# Patient Record
Sex: Female | Born: 1990 | Race: Black or African American | Hispanic: No | Marital: Single | Smoking: Current every day smoker
Health system: Southern US, Community
[De-identification: ages and names within clinical notes are randomized; demographics above are authoritative.]

## PROBLEM LIST (undated history)

## (undated) DIAGNOSIS — A539 Syphilis, unspecified: Secondary | ICD-10-CM

## (undated) DIAGNOSIS — A599 Trichomoniasis, unspecified: Secondary | ICD-10-CM

## (undated) HISTORY — PX: NO PAST SURGERIES: SHX2092

---
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
## 2012-01-05 ENCOUNTER — Emergency Department: Payer: Self-pay | Admitting: Emergency Medicine

## 2013-05-13 ENCOUNTER — Emergency Department: Payer: Self-pay | Admitting: Internal Medicine

## 2013-08-14 ENCOUNTER — Emergency Department: Payer: Self-pay | Admitting: Emergency Medicine

## 2013-08-14 LAB — BASIC METABOLIC PANEL
ANION GAP: 8 (ref 7–16)
BUN: 12 mg/dL (ref 7–18)
CALCIUM: 10.1 mg/dL (ref 8.5–10.1)
CHLORIDE: 108 mmol/L — AB (ref 98–107)
CO2: 23 mmol/L (ref 21–32)
CREATININE: 1.07 mg/dL (ref 0.60–1.30)
EGFR (African American): >60
Glucose: 87 mg/dL (ref 65–99)
Osmolality: 277 (ref 275–301)
POTASSIUM: 3.6 mmol/L (ref 3.5–5.1)
Sodium: 139 mmol/L (ref 136–145)

## 2013-08-14 LAB — CBC
HCT: 42 % (ref 35.0–47.0)
HGB: 13.8 g/dL (ref 12.0–16.0)
MCH: 27.8 pg (ref 26.0–34.0)
MCHC: 32.8 g/dL (ref 32.0–36.0)
MCV: 85 fL (ref 80–100)
PLATELETS: 254 10*3/uL (ref 150–440)
RBC: 4.95 10*6/uL (ref 3.80–5.20)
RDW: 13.2 % (ref 11.5–14.5)
WBC: 6.8 10*3/uL (ref 3.6–11.0)

## 2013-08-14 LAB — TROPONIN I: Troponin-I: <0.02 ng/mL

## 2014-05-06 ENCOUNTER — Emergency Department: Payer: Self-pay | Admitting: Emergency Medicine

## 2015-02-11 ENCOUNTER — Encounter (HOSPITAL_COMMUNITY): Payer: Self-pay | Admitting: Emergency Medicine

## 2015-02-11 ENCOUNTER — Emergency Department (HOSPITAL_COMMUNITY)
Admission: EM | Admit: 2015-02-11 | Discharge: 2015-02-11 | Disposition: A | Discharge status: Home / Self Care | Payer: Self-pay | Attending: Emergency Medicine | Admitting: Emergency Medicine

## 2015-02-11 ENCOUNTER — Emergency Department (HOSPITAL_COMMUNITY): Payer: Self-pay

## 2015-02-11 DIAGNOSIS — Z3202 Encounter for pregnancy test, result negative: Secondary | ICD-10-CM | POA: Insufficient documentation

## 2015-02-11 DIAGNOSIS — N12 Tubulo-interstitial nephritis, not specified as acute or chronic: Secondary | ICD-10-CM | POA: Insufficient documentation

## 2015-02-11 DIAGNOSIS — N3001 Acute cystitis with hematuria: Secondary | ICD-10-CM | POA: Insufficient documentation

## 2015-02-11 DIAGNOSIS — A5901 Trichomonal vulvovaginitis: Secondary | ICD-10-CM | POA: Insufficient documentation

## 2015-02-11 LAB — URINALYSIS, ROUTINE W REFLEX MICROSCOPIC
BILIRUBIN URINE: NEGATIVE
Glucose, UA: NEGATIVE mg/dL
KETONES UR: NEGATIVE mg/dL
NITRITE: POSITIVE — AB
PH: 8 (ref 5.0–8.0)
Protein, ur: NEGATIVE mg/dL
SPECIFIC GRAVITY, URINE: 1.015 (ref 1.005–1.030)
UROBILINOGEN UA: 1 mg/dL (ref 0.0–1.0)

## 2015-02-11 LAB — CBC
HEMATOCRIT: 43.6 % (ref 36.0–46.0)
Hemoglobin: 15 g/dL (ref 12.0–15.0)
MCH: 28.6 pg (ref 26.0–34.0)
MCHC: 34.4 g/dL (ref 30.0–36.0)
MCV: 83 fL (ref 78.0–100.0)
PLATELETS: 236 10*3/uL (ref 150–400)
RBC: 5.25 MIL/uL — ABNORMAL HIGH (ref 3.87–5.11)
RDW: 13 % (ref 11.5–15.5)
WBC: 8.7 10*3/uL (ref 4.0–10.5)

## 2015-02-11 LAB — COMPREHENSIVE METABOLIC PANEL
ALBUMIN: 4.7 g/dL (ref 3.5–5.0)
ALT: 13 U/L — AB (ref 14–54)
AST: 21 U/L (ref 15–41)
Alkaline Phosphatase: 80 U/L (ref 38–126)
Anion gap: 12 (ref 5–15)
BILIRUBIN TOTAL: 0.7 mg/dL (ref 0.3–1.2)
BUN: 9 mg/dL (ref 6–20)
CO2: 21 mmol/L — ABNORMAL LOW (ref 22–32)
CREATININE: 0.95 mg/dL (ref 0.44–1.00)
Calcium: 10.6 mg/dL — ABNORMAL HIGH (ref 8.9–10.3)
Chloride: 105 mmol/L (ref 101–111)
GFR calc Af Amer: >60 mL/min (ref >60)
GLUCOSE: 91 mg/dL (ref 65–99)
POTASSIUM: 4 mmol/L (ref 3.5–5.1)
Sodium: 138 mmol/L (ref 135–145)
TOTAL PROTEIN: 8.2 g/dL — AB (ref 6.5–8.1)

## 2015-02-11 LAB — WET PREP, GENITAL: CLUE CELLS WET PREP: NONE SEEN

## 2015-02-11 LAB — URINE MICROSCOPIC-ADD ON

## 2015-02-11 LAB — I-STAT CG4 LACTIC ACID, ED: Lactic Acid, Venous: 0.99 mmol/L (ref 0.5–2.0)

## 2015-02-11 LAB — LIPASE, BLOOD: Lipase: 31 U/L (ref 22–51)

## 2015-02-11 LAB — I-STAT BETA HCG BLOOD, ED (MC, WL, AP ONLY): I-stat hCG, quantitative: <5 m[IU]/mL (ref <5)

## 2015-02-11 MED ORDER — METRONIDAZOLE 500 MG PO TABS
2000.0000 mg | ORAL_TABLET | Freq: Once | ORAL | Status: AC
  Administered 2015-02-11: 2000 mg via ORAL
  Filled 2015-02-11: qty 4

## 2015-02-11 MED ORDER — SODIUM CHLORIDE 0.9 % IV BOLUS (SEPSIS)
1000.0000 mL | Freq: Once | INTRAVENOUS | Status: AC
  Administered 2015-02-11: 1000 mL via INTRAVENOUS

## 2015-02-11 MED ORDER — ACETAMINOPHEN 325 MG PO TABS
650.0000 mg | ORAL_TABLET | Freq: Once | ORAL | Status: AC
  Administered 2015-02-11: 650 mg via ORAL
  Filled 2015-02-11: qty 2

## 2015-02-11 MED ORDER — ONDANSETRON 4 MG PO TBDP
4.0000 mg | ORAL_TABLET | Freq: Once | ORAL | Status: AC
  Administered 2015-02-11: 4 mg via ORAL

## 2015-02-11 MED ORDER — ONDANSETRON 4 MG PO TBDP
ORAL_TABLET | ORAL | Status: AC
  Filled 2015-02-11: qty 1

## 2015-02-11 MED ORDER — LEVOFLOXACIN 750 MG PO TABS: 750.0000 mg | ORAL_TABLET | Freq: Every day | ORAL | Status: AC

## 2015-02-11 NOTE — Discharge Instructions (Signed)
Pyelonephritis, Adult Pyelonephritis is a kidney infection. A kidney infection can happen quickly, or it can last for a long time. HOME CARE   Take your medicine (antibiotics) as told. Finish it even if you start to feel better.  Keep all doctor visits as told.  Drink enough fluids to keep your pee (urine) clear or pale yellow.  Only take medicine as told by your doctor. GET HELP RIGHT AWAY IF:   You have a fever or lasting symptoms for more than 2-3 days.  You have a fever and your symptoms suddenly get worse.  You cannot take your medicine or drink fluids as told.  You have chills and shaking.  You feel very weak or pass out (faint).  You do not feel better after 2 days. MAKE SURE YOU:  Understand these instructions.  Will watch your condition.  Will get help right away if you are not doing well or get worse. Document Released: 06/08/2004 Document Revised: 10/31/2011 Document Reviewed: 10/19/2010 [facility] Patient Information 2015 [place], [place]. This information is not intended to replace advice given to you by your health care provider. Make sure you discuss any questions you have with your health care provider.  Sexually Transmitted Disease A sexually transmitted disease (STD) is a disease or infection that may be passed (transmitted) from person to person, usually during sexual activity. This may happen by way of saliva, semen, blood, vaginal mucus, or urine. Common STDs include:   Gonorrhea.   Chlamydia.   Syphilis.   HIV and AIDS.   Genital herpes.   Hepatitis B and C.   Trichomonas.   Human papillomavirus (HPV).   Pubic lice.   Scabies.  Mites.  Bacterial vaginosis. WHAT ARE CAUSES OF STDs? An STD may be caused by bacteria, a virus, or parasites. STDs are often transmitted during sexual activity if one person is infected. However, they may also be transmitted through nonsexual means. STDs may be transmitted after:   Sexual intercourse  with an infected person.   Sharing sex toys with an infected person.   Sharing needles with an infected person or using unclean piercing or tattoo needles.  Having intimate contact with the genitals, mouth, or rectal areas of an infected person.   Exposure to infected fluids during birth. WHAT ARE THE SIGNS AND SYMPTOMS OF STDs? Different STDs have different symptoms. Some people may not have any symptoms. If symptoms are present, they may include:   Painful or bloody urination.   Pain in the pelvis, abdomen, vagina, anus, throat, or eyes.   A skin rash, itching, or irritation.  Growths, ulcerations, blisters, or sores in the genital and anal areas.  Abnormal vaginal discharge with or without bad odor.   Penile discharge in men.   Fever.   Pain or bleeding during sexual intercourse.   Swollen glands in the groin area.   Yellow skin and eyes (jaundice). This is seen with hepatitis.   Swollen testicles.  Infertility.  Sores and blisters in the mouth. HOW ARE STDs DIAGNOSED? To make a diagnosis, your health care provider may:   Take a medical history.   Perform a physical exam.   Take a sample of any discharge to examine.  Swab the throat, cervix, opening to the penis, rectum, or vagina for testing.  Test a sample of your first morning urine.   Perform blood tests.   Perform a Pap test, if this applies.   Perform a colposcopy.   Perform a laparoscopy.  HOW ARE STDs  TREATED? Treatment depends on the STD. Some STDs may be treated but not cured.   Chlamydia, gonorrhea, trichomonas, and syphilis can be cured with antibiotic medicine.   Genital herpes, hepatitis, and HIV can be treated, but not cured, with prescribed medicines. The medicines lessen symptoms.   Genital warts from HPV can be treated with medicine or by freezing, burning (electrocautery), or surgery. Warts may come back.   HPV cannot be cured with medicine or surgery.  However, abnormal areas may be removed from the cervix, vagina, or vulva.   If your diagnosis is confirmed, your recent sexual partners need treatment. This is true even if they are symptom-free or have a negative culture or evaluation. They should not have sex until their health care providers say it is okay. HOW CAN I REDUCE MY RISK OF GETTING AN STD? Take these steps to reduce your risk of getting an STD:  Use latex condoms, dental dams, and water-soluble lubricants during sexual activity. Do not use petroleum jelly or oils.  Avoid having multiple sex partners.  Do not have sex with someone who has other sex partners.  Do not have sex with anyone you do not know or who is at high risk for an STD.  Avoid risky sex practices that can break your skin.  Do not have sex if you have open sores on your mouth or skin.  Avoid drinking too much alcohol or taking illegal drugs. Alcohol and drugs can affect your judgment and put you in a vulnerable position.  Avoid engaging in oral and anal sex acts.  Get vaccinated for HPV and hepatitis. If you have not received these vaccines in the past, talk to your health care provider about whether one or both might be right for you.   If you are at risk of being infected with HIV, it is recommended that you take a prescription medicine daily to prevent HIV infection. This is called pre-exposure prophylaxis (PrEP). You are considered at risk if:  You are a man who has sex with other men (MSM).  You are a heterosexual man or woman and are sexually active with more than one partner.  You take drugs by injection.  You are sexually active with a partner who has HIV.  Talk with your health care provider about whether you are at high risk of being infected with HIV. If you choose to begin PrEP, you should first be tested for HIV. You should then be tested every 3 months for as long as you are taking PrEP.  WHAT SHOULD I DO IF I THINK I HAVE AN  STD?  See your health care provider.   Tell your sexual partner(s). They should be tested and treated for any STDs.  Do not have sex until your health care provider says it is okay. WHEN SHOULD I GET IMMEDIATE MEDICAL CARE? Contact your health care provider right away if:   You have severe abdominal pain.  You are a man and notice swelling or pain in your testicles.  You are a woman and notice swelling or pain in your vagina. Document Released: 07/22/2002 Document Revised: 05/06/2013 Document Reviewed: 11/19/2012 [facility] Patient Information 2015 [place], [place]. This information is not intended to replace advice given to you by your health care provider. Make sure you discuss any questions you have with your health care provider.  Trichomoniasis Trichomoniasis is an infection caused by an organism called Trichomonas. The infection can affect both women and men. In women, the outer female  genitalia and the vagina are affected. In men, the penis is mainly affected, but the prostate and other reproductive organs can also be involved. Trichomoniasis is a sexually transmitted infection (STI) and is most often passed to another person through sexual contact.  RISK FACTORS  Having unprotected sexual intercourse.  Having sexual intercourse with an infected partner. SIGNS AND SYMPTOMS  Symptoms of trichomoniasis in women include:  Abnormal gray-green frothy vaginal discharge.  Itching and irritation of the vagina.  Itching and irritation of the area outside the vagina. Symptoms of trichomoniasis in men include:   Penile discharge with or without pain.  Pain during urination. This results from inflammation of the urethra. DIAGNOSIS  Trichomoniasis may be found during a Pap test or physical exam. Your health care provider may use one of the following methods to help diagnose this infection:  Examining vaginal discharge under a microscope. For men, urethral discharge would be  examined.  Testing the pH of the vagina with a test tape.  Using a vaginal swab test that checks for the Trichomonas organism. A test is available that provides results within a few minutes.  Doing a culture test for the organism. This is not usually needed. TREATMENT   You may be given medicine to fight the infection. Women should inform their health care provider if they could be or are pregnant. Some medicines used to treat the infection should not be taken during pregnancy.  Your health care provider may recommend over-the-counter medicines or creams to decrease itching or irritation.  Your sexual partner will need to be treated if infected. HOME CARE INSTRUCTIONS   Take medicines only as directed by your health care provider.  Take over-the-counter medicine for itching or irritation as directed by your health care provider.  Do not have sexual intercourse while you have the infection.  Women should not douche or wear tampons while they have the infection.  Discuss your infection with your partner. Your partner may have gotten the infection from you, or you may have gotten it from your partner.  Have your sex partner get examined and treated if necessary.  Practice safe, informed, and protected sex.  See your health care provider for other STI testing. SEEK MEDICAL CARE IF:   You still have symptoms after you finish your medicine.  You develop abdominal pain.  You have pain when you urinate.  You have bleeding after sexual intercourse.  You develop a rash.  Your medicine makes you sick or makes you throw up (vomit). MAKE SURE YOU:  Understand these instructions.  Will watch your condition.  Will get help right away if you are not doing well or get worse. Document Released: 10/25/2000 Document Revised: 09/15/2013 Document Reviewed: 02/10/2013 [facility] Patient Information 2015 [place], [place]. This information is not intended to replace advice given to you by  your health care provider. Make sure you discuss any questions you have with your health care provider.

## 2015-02-11 NOTE — ED Notes (Signed)
Pt sts abd pain and N/V with some blood in vomit today; pt sts started on Monday; pt hyperventilating at present; pt sts LMP was Feb 2016

## 2015-02-11 NOTE — ED Provider Notes (Signed)
CSN: [id number]     Arrival date & time 02/11/15  1128 History   First MD Initiated Contact with Patient 02/11/15 1456     Chief Complaint  Patient presents with  . Emesis  . Abdominal Pain     (Consider location/radiation/quality/duration/timing/severity/associated sxs/prior Treatment) Patient is a 24 y.o. female presenting with abdominal pain and cough.  Abdominal Pain Associated symptoms: chills, cough, dysuria, fever and shortness of breath   Associated symptoms: no chest pain, no hematuria and no vaginal bleeding   Cough Cough characteristics:  Productive Sputum characteristics:  Bloody, green and yellow Severity:  Moderate Onset quality:  Gradual Duration:  3 days Timing:  Constant Progression:  Worsening Chronicity:  New Context: sick contacts   Relieved by:  None tried Associated symptoms: chills, fever, myalgias, rhinorrhea, shortness of breath and sinus congestion   Associated symptoms: no chest pain, no diaphoresis and no headaches     History reviewed. No pertinent past medical history. History reviewed. No pertinent past surgical history. History reviewed. No pertinent family history. Social History  Substance Use Topics  . Smoking status: Never Smoker   . Smokeless tobacco: None  . Alcohol Use: Yes   OB History    No data available     Review of Systems  Constitutional: Positive for fever and chills. Negative for diaphoresis.  HENT: Positive for congestion and rhinorrhea.   Respiratory: Positive for cough and shortness of breath.   Cardiovascular: Negative for chest pain.  Gastrointestinal: Positive for abdominal pain.  Genitourinary: Positive for dysuria. Negative for hematuria and vaginal bleeding.  Musculoskeletal: Positive for myalgias.  Neurological: Negative for headaches.  All other systems reviewed and are negative.     Allergies  Orange fruit  Home Medications   Prior to Admission medications   Not on File   BP 134/82 mmHg   Pulse 99  Temp(Src) 99.3 F (37.4 C) (Oral)  Resp 18  SpO2 98% Physical Exam  Constitutional: She is oriented to person, place, and time. She appears well-developed. No distress.  HENT:  Head: Normocephalic.  Eyes: Pupils are equal, round, and reactive to light.  Neck: Normal range of motion. Neck supple.  Cardiovascular: Normal rate.   No murmur heard. Pulmonary/Chest: Effort normal. No respiratory distress. She has no wheezes.  Abdominal: Soft. She exhibits no distension and no mass. There is no rebound and no guarding.  Periumbilical tenderness without rebound, without mass.  Slight right sided CVA tenderness   Negative Rosvings  Genitourinary:  No CMT. No adnexal tenderness.   Scant greenish/yellow discharge from cervix.   Musculoskeletal: Normal range of motion. She exhibits no edema or tenderness.  Neurological: She is alert and oriented to person, place, and time. No cranial nerve deficit.  Skin: Skin is warm and dry. She is not diaphoretic. No erythema.  Psychiatric: She has a normal mood and affect. Her behavior is normal.  Nursing note and vitals reviewed.   ED Course  Procedures (including critical care time) Labs Review Labs Reviewed  WET PREP, GENITAL - Abnormal; Notable for the following:    Yeast Wet Prep HPF POC FEW (*)    Trich, Wet Prep FEW (*)    WBC, Wet Prep HPF POC MANY (*)    All other components within normal limits  COMPREHENSIVE METABOLIC PANEL - Abnormal; Notable for the following:    CO2 21 (*)    Calcium 10.6 (*)    Total Protein 8.2 (*)    ALT 13 (*)  All other components within normal limits  CBC - Abnormal; Notable for the following:    RBC 5.25 (*)    All other components within normal limits  URINALYSIS, ROUTINE W REFLEX MICROSCOPIC (NOT AT [facility]) - Abnormal; Notable for the following:    APPearance HAZY (*)    Hgb urine dipstick LARGE (*)    Nitrite POSITIVE (*)    Leukocytes, UA LARGE (*)    All other components within normal  limits  URINE MICROSCOPIC-ADD ON - Abnormal; Notable for the following:    Squamous Epithelial / LPF MANY (*)    Bacteria, UA MANY (*)    All other components within normal limits  LIPASE, BLOOD  I-STAT BETA HCG BLOOD, ED ([facility], [facility], [facility] ONLY)  I-STAT CG4 LACTIC ACID, ED  GC/CHLAMYDIA PROBE AMP ([facility]) NOT AT [facility]   Imaging Review Dg Chest 2 View  02/11/2015   CLINICAL DATA:  Cough and fever for 1 day  EXAM: CHEST  2 VIEW  COMPARISON:  May 06, 2014  FINDINGS: Lungs are clear. Heart size and pulmonary vascularity are normal. No adenopathy. No bone lesions. There is slight thoracolumbar levoscoliosis.  IMPRESSION: No edema or consolidation.   Electronically Signed   By: Bretta Bang III M.D.   On: 02/11/2015 16:02   I have personally reviewed and evaluated these images and lab results as part of my medical decision-making.   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 Her sister has history of similar symptoms. Patient states that today she started noticing that her sputum changed from yellowish green color to a yellow bloody color. She feels slightly short of breath upon exertion and has pinpoint chest Inocente Krach pain of her left side. She denies any other bleeding. She also endorses some dysuria but has no vaginal discharge. Vomiting is only post-tussive emesis.  Patient overall well appearing but with significant congestion. UTI found with labs and given  chills, treated for pyelonephritis. Patient had scant vaginal discharge with trichomonas. Does not want empiric treatment for G/C at this time. Patient given strict return precauons and discharged home after NS bolus.    Final diagnoses:  Trichomonas vaginitis  Acute cystitis with hematuria  Pyelonephritis      Deirdre Peer, MD 02/12/15 [id number]  Deirdre Peer, MD 02/12/15 5956  Pricilla Loveless, MD 02/14/15 [id number]

## 2015-02-12 LAB — GC/CHLAMYDIA PROBE AMP (~~LOC~~) NOT AT ARMC
CHLAMYDIA, DNA PROBE: NEGATIVE
NEISSERIA GONORRHEA: NEGATIVE

## 2015-02-15 ENCOUNTER — Telehealth: Payer: Self-pay | Admitting: *Deleted

## 2015-02-15 NOTE — Telephone Encounter (Signed)
Pt calling stating Rx prescribed is too expensive and she can not afford them.  NCM searched [employer]Rx[employer]com for an affordable coupon.  NCM text coupon to pt phone and stayed online until it was received.  Pt very appreciative.   

## 2015-05-05 ENCOUNTER — Inpatient Hospital Stay (HOSPITAL_COMMUNITY)
Admission: AD | Admit: 2015-05-05 | Discharge: 2015-05-05 | Disposition: A | Discharge status: Home / Self Care | Payer: Self-pay | Source: Ambulatory Visit | Attending: Family Medicine | Admitting: Family Medicine

## 2015-05-05 ENCOUNTER — Encounter (HOSPITAL_COMMUNITY): Payer: Self-pay

## 2015-05-05 DIAGNOSIS — R102 Pelvic and perineal pain: Secondary | ICD-10-CM | POA: Insufficient documentation

## 2015-05-05 DIAGNOSIS — Z202 Contact with and (suspected) exposure to infections with a predominantly sexual mode of transmission: Secondary | ICD-10-CM

## 2015-05-05 DIAGNOSIS — Z30011 Encounter for initial prescription of contraceptive pills: Secondary | ICD-10-CM

## 2015-05-05 DIAGNOSIS — A5901 Trichomonal vulvovaginitis: Secondary | ICD-10-CM

## 2015-05-05 LAB — CBC
HEMATOCRIT: 38.7 % (ref 36.0–46.0)
HEMOGLOBIN: 13.3 g/dL (ref 12.0–15.0)
MCH: 28.7 pg (ref 26.0–34.0)
MCHC: 34.4 g/dL (ref 30.0–36.0)
MCV: 83.6 fL (ref 78.0–100.0)
Platelets: 259 10*3/uL (ref 150–400)
RBC: 4.63 MIL/uL (ref 3.87–5.11)
RDW: 13.4 % (ref 11.5–15.5)
WBC: 10.2 10*3/uL (ref 4.0–10.5)

## 2015-05-05 LAB — URINALYSIS, ROUTINE W REFLEX MICROSCOPIC
Bilirubin Urine: NEGATIVE
GLUCOSE, UA: NEGATIVE mg/dL
HGB URINE DIPSTICK: NEGATIVE
Ketones, ur: NEGATIVE mg/dL
Nitrite: NEGATIVE
PH: 6 (ref 5.0–8.0)
Protein, ur: NEGATIVE mg/dL
SPECIFIC GRAVITY, URINE: 1.02 (ref 1.005–1.030)

## 2015-05-05 LAB — URINE MICROSCOPIC-ADD ON

## 2015-05-05 LAB — WET PREP, GENITAL
Clue Cells Wet Prep HPF POC: NONE SEEN
SPERM: NONE SEEN
Yeast Wet Prep HPF POC: NONE SEEN

## 2015-05-05 LAB — POCT PREGNANCY, URINE: PREG TEST UR: NEGATIVE

## 2015-05-05 MED ORDER — KETOROLAC TROMETHAMINE 60 MG/2ML IM SOLN
60.0000 mg | Freq: Once | INTRAMUSCULAR | Status: AC
  Administered 2015-05-05: 60 mg via INTRAMUSCULAR
  Filled 2015-05-05: qty 2

## 2015-05-05 MED ORDER — METRONIDAZOLE 500 MG PO TABS
2000.0000 mg | ORAL_TABLET | Freq: Once | ORAL | Status: AC
  Administered 2015-05-05: 2000 mg via ORAL
  Filled 2015-05-05: qty 4

## 2015-05-05 MED ORDER — NORGESTIMATE-ETH ESTRADIOL 0.25-35 MG-MCG PO TABS: 1.0000 | ORAL_TABLET | Freq: Every day | ORAL | Status: DC

## 2015-05-05 NOTE — MAU Provider Note (Signed)
History     CSN: [id number]  Arrival date and time: 05/05/15 1055   First Provider Initiated Contact with Patient 05/05/15 1221         Chief Complaint  Patient presents with  . Abdominal Pain  . Amenorrhea    Crystal Short is a 24 y.o. female who presents for abdominal cramping & vaginal discharge.  Treated for trich a few months ago but had intercourse with partner 1 day after treatment. Denies intercourse since then. Requesting additional STD testing at this time. Symptoms started 3 weeks ago.   Exposure to STD  The patient's primary symptoms include a discharge and pelvic pain. The patient's pertinent negatives include no dysuria. This is a new problem. The current episode started 1 to 4 weeks ago. The problem has been unchanged. The vaginal discharge was malodorous, yellow and thick. Associate symptoms include abdominal pain and a genital odor. Pertinent negatives include no fever or urinary frequency. She has tried NSAIDs for the symptoms. The treatment provided mild relief. Risk factors include history of STDs.  Abdominal Cramping This is a new problem. The current episode started 1 to 4 weeks ago. The problem occurs intermittently. The problem has been unchanged. The pain is located in the suprapubic region. The pain is at a severity of 5/10. The quality of the pain is cramping. The abdominal pain does not radiate. Associated symptoms include nausea. Pertinent negatives include no constipation, diarrhea, dysuria, fever, frequency, hematuria or vomiting. Nothing aggravates the pain. The pain is relieved by nothing.    OB History    Gravida Para Term Preterm AB TAB SAB Ectopic Multiple Living   1    1  1    0      Past Medical History  Diagnosis Date  . Medical history non-contributory     Past Surgical History  Procedure Laterality Date  . No past surgeries      Family History  Problem Relation Age of Onset  . Diabetes Mother   . Diabetes Brother   . Diabetes  Maternal Grandmother     Social History  Substance Use Topics  . Smoking status: Never Smoker   . Smokeless tobacco: Never Used  . Alcohol Use: Yes    Allergies:  Allergies  Allergen Reactions  . Orange Fruit [Citrus]     Prescriptions prior to admission  Medication Sig Dispense Refill Last Dose  . acetaminophen (TYLENOL) 500 MG tablet Take 1,000 mg by mouth every 6 (six) hours as needed for headache.   05/04/2015 at Unknown time  . Aspirin-Salicylamide-Caffeine (BC HEADACHE POWDER PO) Take 1 packet by mouth every 8 (eight) hours as needed (headache).   05/04/2015 at Unknown time    Review of Systems  Constitutional: Negative.  Negative for fever.  Gastrointestinal: Positive for nausea and abdominal pain. Negative for vomiting, diarrhea and constipation.  Genitourinary: Positive for pelvic pain. Negative for dysuria, frequency and hematuria.       + vaginal discharge No vaginal bleeding No dyspareunia or post coital bleeding   Physical Exam   Blood pressure 96/72, pulse 51, temperature 97.9 F (36.6 C), temperature source Oral, resp. rate 18, height 5\' 6"  (1.676 m), weight 133 lb 9.6 oz (60.601 kg).  Physical Exam  Nursing note and vitals reviewed. Constitutional: She is oriented to person, place, and time. She appears well-developed and well-nourished. No distress.  HENT:  Head: Normocephalic and atraumatic.  Eyes: Conjunctivae are normal. Right eye exhibits no discharge. Left eye exhibits no discharge. No  scleral icterus.  Neck: Normal range of motion.  Cardiovascular: Normal rate, regular rhythm and normal heart sounds.   No murmur heard. Respiratory: Effort normal and breath sounds normal. No respiratory distress. She has no wheezes.  GI: Soft. Bowel sounds are normal. She exhibits no distension. There is no tenderness. There is no rebound.  Genitourinary: Vagina normal and uterus normal. Cervix exhibits discharge (minimal amount of thin white frothy discharge).  Cervix exhibits no motion tenderness and no friability.  Neurological: She is alert and oriented to person, place, and time.  Skin: Skin is warm and dry. She is not diaphoretic.  Psychiatric: She has a normal mood and affect. Her behavior is normal. Judgment and thought content normal.    MAU Course  Procedures Results for orders placed or performed during the hospital encounter of 05/05/15 (from the past 24 hour(s))  Urinalysis, Routine w reflex microscopic (not at [facility])     Status: Abnormal   Collection Time: 05/05/15 11:05 AM  Result Value Ref Range   Color, Urine YELLOW YELLOW   APPearance CLEAR CLEAR   Specific Gravity, Urine 1.020 1.005 - 1.030   pH 6.0 5.0 - 8.0   Glucose, UA NEGATIVE NEGATIVE mg/dL   Hgb urine dipstick NEGATIVE NEGATIVE   Bilirubin Urine NEGATIVE NEGATIVE   Ketones, ur NEGATIVE NEGATIVE mg/dL   Protein, ur NEGATIVE NEGATIVE mg/dL   Nitrite NEGATIVE NEGATIVE   Leukocytes, UA MODERATE (A) NEGATIVE  Urine microscopic-add on     Status: Abnormal   Collection Time: 05/05/15 11:05 AM  Result Value Ref Range   Squamous Epithelial / LPF 0-5 (A) NONE SEEN   WBC, UA 0-5 0 - 5 WBC/hpf   RBC / HPF 0-5 0 - 5 RBC/hpf   Bacteria, UA FEW (A) NONE SEEN   Urine-Other TRICHOMONAS PRESENT   Pregnancy, urine POC     Status: None   Collection Time: 05/05/15 11:15 AM  Result Value Ref Range   Preg Test, Ur NEGATIVE NEGATIVE  CBC     Status: None   Collection Time: 05/05/15 12:05 PM  Result Value Ref Range   WBC 10.2 4.0 - 10.5 K/uL   RBC 4.63 3.87 - 5.11 MIL/uL   Hemoglobin 13.3 12.0 - 15.0 g/dL   HCT 81.1 91.4 - 78.2 %   MCV 83.6 78.0 - 100.0 fL   MCH 28.7 26.0 - 34.0 pg   MCHC 34.4 30.0 - 36.0 g/dL   RDW 95.6 21.3 - 08.6 %   Platelets 259 150 - 400 K/uL  Wet prep, genital     Status: Abnormal   Collection Time: 05/05/15 12:20 PM  Result Value Ref Range   Yeast Wet Prep HPF POC NONE SEEN NONE SEEN   Trich, Wet Prep PRESENT (A) NONE SEEN   Clue Cells Wet Prep HPF  POC NONE SEEN NONE SEEN   WBC, Wet Prep HPF POC MANY (A) NONE SEEN   Sperm NONE SEEN     MDM UPT negative Flagyl 2 gm given in MAU  At time of discharge patient requesting birth control pills. Last contraception was depo 1 year ago. Will give patient few months of OCP & stressed importance of her following up with PCP for contraception maintenance.   Assessment and Plan  A: 1. Trichomoniasis of vagina   2. STD exposure   3. Encounter for initial prescription of contraceptive pills    P: Discharge home GC/CT, HIV, RPR pending Rx sprintec x 2 months Call PCP of choice to start  routine care  Judeth Horn, NP  05/05/2015, 11:41 AM

## 2015-05-05 NOTE — MAU Note (Signed)
Had a cold, been having really really bad stomach pains-started the beginning of this month.  Hasn't had a period since Feb.  Has not done a home test.

## 2015-05-05 NOTE — Discharge Instructions (Signed)
Trichomoniasis °Trichomoniasis is an infection caused by an organism called Trichomonas. The infection can affect both women and men. In women, the outer female genitalia and the vagina are affected. In men, the penis is mainly affected, but the prostate and other reproductive organs can also be involved. Trichomoniasis is a sexually transmitted infection (STI) and is most often passed to another person through sexual contact.  °RISK FACTORS °· Having unprotected sexual intercourse. °· Having sexual intercourse with an infected partner. °SIGNS AND SYMPTOMS  °Symptoms of trichomoniasis in women include: °· Abnormal gray-green frothy vaginal discharge. °· Itching and irritation of the vagina. °· Itching and irritation of the area outside the vagina. °Symptoms of trichomoniasis in men include:  °· Penile discharge with or without pain. °· Pain during urination. This results from inflammation of the urethra. °DIAGNOSIS  °Trichomoniasis may be found during a Pap test or physical exam. Your health care provider may use one of the following methods to help diagnose this infection: °· Testing the pH of the vagina with a test tape. °· Using a vaginal swab test that checks for the Trichomonas organism. A test is available that provides results within a few minutes. °· Examining a urine sample. °· Testing vaginal secretions. °Your health care provider may test you for other STIs, including HIV. °TREATMENT  °· You may be given medicine to fight the infection. Women should inform their health care provider if they could be or are pregnant. Some medicines used to treat the infection should not be taken during pregnancy. °· Your health care provider may recommend over-the-counter medicines or creams to decrease itching or irritation. °· Your sexual partner will need to be treated if infected. °· Your health care provider may test you for infection again 3 months after treatment. °HOME CARE INSTRUCTIONS  °· Take medicines only as  directed by your health care provider. °· Take over-the-counter medicine for itching or irritation as directed by your health care provider. °· Do not have sexual intercourse while you have the infection. °· Women should not douche or wear tampons while they have the infection. °· Discuss your infection with your partner. Your partner may have gotten the infection from you, or you may have gotten it from your partner. °· Have your sex partner get examined and treated if necessary. °· Practice safe, informed, and protected sex. °· See your health care provider for other STI testing. °SEEK MEDICAL CARE IF:  °· You still have symptoms after you finish your medicine. °· You develop abdominal pain. °· You have pain when you urinate. °· You have bleeding after sexual intercourse. °· You develop a rash. °· Your medicine makes you sick or makes you throw up (vomit). °MAKE SURE YOU: °· Understand these instructions. °· Will watch your condition. °· Will get help right away if you are not doing well or get worse. °  °This information is not intended to replace advice given to you by your health care provider. Make sure you discuss any questions you have with your health care provider. °  °Document Released: 10/25/2000 Document Revised: 05/22/2014 Document Reviewed: 02/10/2013 °Elsevier Interactive Patient Education ©2016 Elsevier Inc[employer] ° °Sexually Transmitted Disease °A sexually transmitted disease (STD) is a disease or infection that may be passed (transmitted) from person to person, usually during sexual activity. This may happen by way of saliva, semen, blood, vaginal mucus, or urine. Common STDs include: °· Gonorrhea. °· Chlamydia. °· Syphilis. °· HIV and AIDS. °· Genital herpes. °· Hepatitis B and   C. °· Trichomonas. °· Human papillomavirus (HPV). °· Pubic lice. °· Scabies. °· Mites. °· Bacterial vaginosis. °WHAT ARE CAUSES OF STDs? °An STD may be caused by bacteria, a virus, or parasites. STDs are often transmitted during  sexual activity if one person is infected. However, they may also be transmitted through nonsexual means. STDs may be transmitted after:  °· Sexual intercourse with an infected person. °· Sharing sex toys with an infected person. °· Sharing needles with an infected person or using unclean piercing or tattoo needles. °· Having intimate contact with the genitals, mouth, or rectal areas of an infected person. °· Exposure to infected fluids during birth. °WHAT ARE THE SIGNS AND SYMPTOMS OF STDs? °Different STDs have different symptoms. Some people may not have any symptoms. If symptoms are present, they may include: °· Painful or bloody urination. °· Pain in the pelvis, abdomen, vagina, anus, throat, or eyes. °· A skin rash, itching, or irritation. °· Growths, ulcerations, blisters, or sores in the genital and anal areas. °· Abnormal vaginal discharge with or without bad odor. °· Penile discharge in men. °· Fever. °· Pain or bleeding during sexual intercourse. °· Swollen glands in the groin area. °· Yellow skin and eyes (jaundice). This is seen with hepatitis. °· Swollen testicles. °· Infertility. °· Sores and blisters in the mouth. °HOW ARE STDs DIAGNOSED? °To make a diagnosis, your health care provider may: °· Take a medical history. °· Perform a physical exam. °· Take a sample of any discharge to examine. °· Swab the throat, cervix, opening to the penis, rectum, or vagina for testing. °· Test a sample of your first morning urine. °· Perform blood tests. °· Perform a Pap test, if this applies. °· Perform a colposcopy. °· Perform a laparoscopy. °HOW ARE STDs TREATED? °Treatment depends on the STD. Some STDs may be treated but not cured. °· Chlamydia, gonorrhea, trichomonas, and syphilis can be cured with antibiotic medicine. °· Genital herpes, hepatitis, and HIV can be treated, but not cured, with prescribed medicines. The medicines lessen symptoms. °· Genital warts from HPV can be treated with medicine or by freezing,  burning (electrocautery), or surgery. Warts may come back. °· HPV cannot be cured with medicine or surgery. However, abnormal areas may be removed from the cervix, vagina, or vulva. °· If your diagnosis is confirmed, your recent sexual partners need treatment. This is true even if they are symptom-free or have a negative culture or evaluation. They should not have sex until their health care providers say it is okay. °· Your health care provider may test you for infection again 3 months after treatment. °HOW CAN I REDUCE MY RISK OF GETTING AN STD? °Take these steps to reduce your risk of getting an STD: °· Use latex condoms, dental dams, and water-soluble lubricants during sexual activity. Do not use petroleum jelly or oils. °· Avoid having multiple sex partners. °· Do not have sex with someone who has other sex partners °· Do not have sex with anyone you do not know or who is at high risk for an STD. °· Avoid risky sex practices that can break your skin. °· Do not have sex if you have open sores on your mouth or skin. °· Avoid drinking too much alcohol or taking illegal drugs. Alcohol and drugs can affect your judgment and put you in a vulnerable position. °· Avoid engaging in oral and anal sex acts. °· Get vaccinated for HPV and hepatitis. If you have not received these vaccines in   the past, talk to your health care provider about whether one or both might be right for you.  If you are at risk of being infected with HIV, it is recommended that you take a prescription medicine daily to prevent HIV infection. This is called pre-exposure prophylaxis (PrEP). You are considered at risk if:  You are a man who has sex with other men (MSM).  You are a heterosexual man or woman and are sexually active with more than one partner.  You take drugs by injection.  You are sexually active with a partner who has HIV.  Talk with your health care provider about whether you are at high risk of being infected with HIV. If  you choose to begin PrEP, you should first be tested for HIV. You should then be tested every 3 months for as long as you are taking PrEP. WHAT SHOULD I DO IF I THINK I HAVE AN STD?  See your health care provider.  Tell your sexual partner(s). They should be tested and treated for any STDs.  Do not have sex until your health care provider says it is okay. WHEN SHOULD I GET IMMEDIATE MEDICAL CARE? Contact your health care provider right away if:   You have severe abdominal pain.  You are a man and notice swelling or pain in your testicles.  You are a woman and notice swelling or pain in your vagina.   This information is not intended to replace advice given to you by your health care provider. Make sure you discuss any questions you have with your health care provider.   Document Released: 07/22/2002 Document Revised: 05/22/2014 Document Reviewed: 11/19/2012 Elsevier Interactive Patient Education 2016 [employer] Safe Sex Safe sex is about reducing the risk of giving or getting a sexually transmitted disease (STD). STDs are spread through sexual contact involving the genitals, mouth, or rectum. Some STDs can be cured and others cannot. Safe sex can also prevent unintended pregnancies.  WHAT ARE SOME SAFE SEX PRACTICES?  Limit your sexual activity to only one partner who is having sex with only you.  Talk to your partner about his or her past partners, past STDs, and drug use.  Use a condom every time you have sexual intercourse. This includes vaginal, oral, and anal sexual activity. Both females and males should wear condoms during oral sex. Only use latex or polyurethane condoms and water-based lubricants. Using petroleum-based lubricants or oils to lubricate a condom will weaken the condom and increase the chance that it will break. The condom should be in place from the beginning to the end of sexual activity. Wearing a condom reduces, but does not completely eliminate, your risk of  getting or giving an STD. STDs can be spread by contact with infected body fluids and skin.  Get vaccinated for hepatitis B and HPV.  Avoid alcohol and recreational drugs, which can affect your judgment. You may forget to use a condom or participate in high-risk sex.  For females, avoid douching after sexual intercourse. Douching can spread an infection farther into the reproductive tract.  Check your body for signs of sores, blisters, rashes, or unusual discharge. See your health care provider if you notice any of these signs.  Avoid sexual contact if you have symptoms of an infection or are being treated for an STD. If you or your partner has herpes, avoid sexual contact when blisters are present. Use condoms at all other times.  If you are at risk of being infected  with HIV, it is recommended that you take a prescription medicine daily to prevent HIV infection. This is called pre-exposure prophylaxis (PrEP). You are considered at risk if:  You are a man who has sex with other men (MSM).  You are a heterosexual man or woman who is sexually active with more than one partner.  You take drugs by injection.  You are sexually active with a partner who has HIV.  Talk with your health care provider about whether you are at high risk of being infected with HIV. If you choose to begin PrEP, you should first be tested for HIV. You should then be tested every 3 months for as long as you are taking PrEP.  See your health care provider for regular screenings, exams, and tests for other STDs. Before having sex with a new partner, each of you should be screened for STDs and should talk about the results with each other. WHAT ARE THE BENEFITS OF SAFE SEX?   There is less chance of getting or giving an STD.  You can prevent unwanted or unintended pregnancies.  By discussing safe sex concerns with your partner, you may increase feelings of intimacy, comfort, trust, and honesty between the two of  you.   This information is not intended to replace advice given to you by your health care provider. Make sure you discuss any questions you have with your health care provider.   Document Released: 06/08/2004 Document Revised: 05/22/2014 Document Reviewed: 10/23/2011 Elsevier Interactive Patient Education [employer]

## 2015-05-06 LAB — RPR: RPR Ser Ql: NONREACTIVE

## 2015-05-06 LAB — URINE CULTURE

## 2015-05-06 LAB — GC/CHLAMYDIA PROBE AMP (~~LOC~~) NOT AT ARMC
Chlamydia: NEGATIVE
NEISSERIA GONORRHEA: NEGATIVE

## 2015-05-06 LAB — HIV ANTIBODY (ROUTINE TESTING W REFLEX): HIV SCREEN 4TH GENERATION: NONREACTIVE

## 2015-06-07 ENCOUNTER — Inpatient Hospital Stay (HOSPITAL_COMMUNITY)
Admission: AD | Admit: 2015-06-07 | Discharge: 2015-06-07 | Disposition: A | Discharge status: Home / Self Care | Payer: Self-pay | Source: Ambulatory Visit | Attending: Family Medicine | Admitting: Family Medicine

## 2015-06-07 ENCOUNTER — Encounter (HOSPITAL_COMMUNITY): Payer: Self-pay | Admitting: *Deleted

## 2015-06-07 DIAGNOSIS — R109 Unspecified abdominal pain: Secondary | ICD-10-CM | POA: Insufficient documentation

## 2015-06-07 DIAGNOSIS — A499 Bacterial infection, unspecified: Secondary | ICD-10-CM

## 2015-06-07 DIAGNOSIS — B9689 Other specified bacterial agents as the cause of diseases classified elsewhere: Secondary | ICD-10-CM

## 2015-06-07 DIAGNOSIS — N760 Acute vaginitis: Secondary | ICD-10-CM

## 2015-06-07 DIAGNOSIS — Z202 Contact with and (suspected) exposure to infections with a predominantly sexual mode of transmission: Secondary | ICD-10-CM | POA: Insufficient documentation

## 2015-06-07 HISTORY — DX: Trichomoniasis, unspecified: A59.9

## 2015-06-07 LAB — WET PREP, GENITAL
SPERM: NONE SEEN
Trich, Wet Prep: NONE SEEN
Yeast Wet Prep HPF POC: NONE SEEN

## 2015-06-07 LAB — URINALYSIS, ROUTINE W REFLEX MICROSCOPIC
Bilirubin Urine: NEGATIVE
Glucose, UA: NEGATIVE mg/dL
Hgb urine dipstick: NEGATIVE
KETONES UR: NEGATIVE mg/dL
NITRITE: NEGATIVE
PH: 6 (ref 5.0–8.0)
PROTEIN: NEGATIVE mg/dL
Specific Gravity, Urine: 1.01 (ref 1.005–1.030)

## 2015-06-07 LAB — URINE MICROSCOPIC-ADD ON
Bacteria, UA: NONE SEEN
RBC / HPF: NONE SEEN RBC/hpf (ref 0–5)

## 2015-06-07 LAB — POCT PREGNANCY, URINE: Preg Test, Ur: NEGATIVE

## 2015-06-07 MED ORDER — METRONIDAZOLE 0.75 % VA GEL: 1.0000 | Freq: Two times a day (BID) | VAGINAL | Status: DC

## 2015-06-07 NOTE — MAU Note (Signed)
Was here last year, thinks she had an STD, had sex before the 7 days were up.  So is here to be checked again. Having stomach pains.  Started 3 days ago

## 2015-06-07 NOTE — MAU Provider Note (Signed)
History   G1P0 not pregnant in with recurrent Trich exposure from partner. Lengthy discussion on STD prevention with pt.   CSN: [id number]  Arrival date & time 06/07/15  1659   None     Chief Complaint  Patient presents with  . Abdominal Pain    HPI  Past Medical History  Diagnosis Date  . Trichomonas infection     Past Surgical History  Procedure Laterality Date  . No past surgeries      Family History  Problem Relation Age of Onset  . Diabetes Mother   . Diabetes Brother   . Diabetes Maternal Grandmother     Social History  Substance Use Topics  . Smoking status: Never Smoker   . Smokeless tobacco: Never Used  . Alcohol Use: Yes     Comment: twice a week    OB History    Gravida Para Term Preterm AB TAB SAB Ectopic Multiple Living   0      Review of Systems  Constitutional: Negative.   HENT: Negative.   Eyes: Negative.   Respiratory: Negative.   Cardiovascular: Negative.   Gastrointestinal: Positive for abdominal pain.  Genitourinary: Positive for vaginal discharge.  Musculoskeletal: Negative.   Skin: Negative.   Allergic/Immunologic: Negative.   Neurological: Negative.   Hematological: Negative.   Psychiatric/Behavioral: Negative.     Allergies  Orange fruit  Home Medications  No current outpatient prescriptions on file.  BP 111/77 mmHg  Pulse 69  Temp(Src) 98.2 F (36.8 C) (Oral)  Resp 16  Ht  (1.676 m)  Wt 138 lb (62.596 kg)  BMI 22.28 kg/m2  LMP   Physical Exam  Constitutional: She is oriented to person, place, and time. She appears well-developed and well-nourished.  HENT:  Head: Normocephalic.  Eyes: Pupils are equal, round, and reactive to light.  Neck: Normal range of motion.  Cardiovascular: Normal rate, regular rhythm, normal heart sounds and intact distal pulses.   Pulmonary/Chest: Effort normal and breath sounds normal.  Abdominal: Soft. Bowel sounds are normal.  Genitourinary:  Ammine odor noted  with exam  Musculoskeletal: Normal range of motion.  Neurological: She is alert and oriented to person, place, and time. She has normal reflexes.  Skin: Skin is warm and dry.  Psychiatric: She has a normal mood and affect. Her behavior is normal. Judgment and thought content normal.    MAU Course  Procedures (including critical care time)  Labs Reviewed  WET PREP, GENITAL  RPR  URINALYSIS, ROUTINE W REFLEX MICROSCOPIC (NOT AT [facility])  POCT PREGNANCY, URINE   No results found.   No diagnosis found.    MDM  Wet prep pos clue no trich, BV  will tx accordingly

## 2015-06-07 NOTE — Discharge Instructions (Signed)

## 2015-06-08 LAB — RPR: RPR Ser Ql: NONREACTIVE

## 2015-09-07 ENCOUNTER — Encounter (HOSPITAL_COMMUNITY): Payer: Self-pay | Admitting: *Deleted

## 2015-09-07 ENCOUNTER — Emergency Department (HOSPITAL_COMMUNITY)
Admission: EM | Admit: 2015-09-07 | Discharge: 2015-09-08 | Disposition: A | Discharge status: Home / Self Care | Payer: Self-pay | Attending: Emergency Medicine | Admitting: Emergency Medicine

## 2015-09-07 DIAGNOSIS — Z8619 Personal history of other infectious and parasitic diseases: Secondary | ICD-10-CM | POA: Insufficient documentation

## 2015-09-07 DIAGNOSIS — Z3202 Encounter for pregnancy test, result negative: Secondary | ICD-10-CM | POA: Insufficient documentation

## 2015-09-07 DIAGNOSIS — N939 Abnormal uterine and vaginal bleeding, unspecified: Secondary | ICD-10-CM

## 2015-09-07 DIAGNOSIS — G43009 Migraine without aura, not intractable, without status migrainosus: Secondary | ICD-10-CM | POA: Insufficient documentation

## 2015-09-07 DIAGNOSIS — N938 Other specified abnormal uterine and vaginal bleeding: Secondary | ICD-10-CM | POA: Insufficient documentation

## 2015-09-07 DIAGNOSIS — Z79899 Other long term (current) drug therapy: Secondary | ICD-10-CM | POA: Insufficient documentation

## 2015-09-07 LAB — URINALYSIS, ROUTINE W REFLEX MICROSCOPIC
Bilirubin Urine: NEGATIVE
GLUCOSE, UA: NEGATIVE mg/dL
Ketones, ur: NEGATIVE mg/dL
Nitrite: NEGATIVE
PH: 7.5 (ref 5.0–8.0)
PROTEIN: NEGATIVE mg/dL
Specific Gravity, Urine: 1.021 (ref 1.005–1.030)

## 2015-09-07 LAB — CBC
HCT: 39.1 % (ref 36.0–46.0)
HEMOGLOBIN: 13.4 g/dL (ref 12.0–15.0)
MCH: 28.5 pg (ref 26.0–34.0)
MCHC: 34.3 g/dL (ref 30.0–36.0)
MCV: 83.2 fL (ref 78.0–100.0)
PLATELETS: 268 10*3/uL (ref 150–400)
RBC: 4.7 MIL/uL (ref 3.87–5.11)
RDW: 12.6 % (ref 11.5–15.5)
WBC: 9.9 10*3/uL (ref 4.0–10.5)

## 2015-09-07 LAB — URINE MICROSCOPIC-ADD ON

## 2015-09-07 LAB — COMPREHENSIVE METABOLIC PANEL
ALBUMIN: 4.3 g/dL (ref 3.5–5.0)
ALK PHOS: 71 U/L (ref 38–126)
ALT: 11 U/L — AB (ref 14–54)
AST: 19 U/L (ref 15–41)
Anion gap: 9 (ref 5–15)
BILIRUBIN TOTAL: 0.8 mg/dL (ref 0.3–1.2)
BUN: 12 mg/dL (ref 6–20)
CALCIUM: 10 mg/dL (ref 8.9–10.3)
CO2: 25 mmol/L (ref 22–32)
CREATININE: 0.94 mg/dL (ref 0.44–1.00)
Chloride: 108 mmol/L (ref 101–111)
GFR calc Af Amer: >60 mL/min (ref >60)
GFR calc non Af Amer: >60 mL/min (ref >60)
GLUCOSE: 97 mg/dL (ref 65–99)
Potassium: 4 mmol/L (ref 3.5–5.1)
SODIUM: 142 mmol/L (ref 135–145)
TOTAL PROTEIN: 7.4 g/dL (ref 6.5–8.1)

## 2015-09-07 LAB — POC URINE PREG, ED: Preg Test, Ur: NEGATIVE

## 2015-09-07 LAB — LIPASE, BLOOD: Lipase: 28 U/L (ref 11–51)

## 2015-09-07 MED ORDER — PROCHLORPERAZINE EDISYLATE 5 MG/ML IJ SOLN
10.0000 mg | Freq: Once | INTRAMUSCULAR | Status: AC
  Administered 2015-09-07: 10 mg via INTRAMUSCULAR
  Filled 2015-09-07: qty 2

## 2015-09-07 MED ORDER — DIPHENHYDRAMINE HCL 50 MG/ML IJ SOLN
25.0000 mg | Freq: Once | INTRAMUSCULAR | Status: AC
  Administered 2015-09-07: 25 mg via INTRAMUSCULAR
  Filled 2015-09-07: qty 1

## 2015-09-07 NOTE — ED Notes (Signed)
The pt has a headache with vomiting for 2 days and she has also had a period for 3 weeks  lmp  2015 feb.  No operiod since feb 2015 after she had an abortiion

## 2015-09-07 NOTE — ED Notes (Signed)
MD at bedside. 

## 2015-09-07 NOTE — ED Provider Notes (Signed)
CSN: [id number]     Arrival date & time 09/07/15  1642 History   First MD Initiated Contact with Patient 09/07/15 2228     Chief Complaint  Patient presents with  . Headache     (Consider location/radiation/quality/duration/timing/severity/associated sxs/prior Treatment) HPI 25 y.o. female with history of migraines presents to the ED noting a 2 day history of left-sided headache associated with photophobia, phonophobia, nausea, vomiting nonbloody emesis. She states that this is similar in character to her previous headaches but more severe. He denies any associated visual changes nor aura symptoms. She denies any recent head trauma. She stated that she had viral URI like symptoms a few days prior to the onset of her headache symptoms, but states that those symptoms have now resolved. She has been taking BC powder to no avail of her symptoms. Denies any focal neurologic deficits, difficulty walking, or speaking.    Additionally, she notes a hx of menstrual period that has been waxing and waning over the last 3 weeks. She states that she has been amenorrheic since February of 2016 when she had a surgical abortion. This has been the return of her menses over the last few weeks. She has not followed up with an OBGYN. She has not been using any OCPs or depo shot during this time.     Past Medical History  Diagnosis Date  . Trichomonas infection    Past Surgical History  Procedure Laterality Date  . No past surgeries     Family History  Problem Relation Age of Onset  . Diabetes Mother   . Diabetes Brother   . Diabetes Maternal Grandmother    Social History  Substance Use Topics  . Smoking status: Never Smoker   . Smokeless tobacco: Never Used  . Alcohol Use: Yes     Comment: twice a week   OB History    Gravida Para Term Preterm AB TAB SAB Ectopic Multiple Living   1    1  1    0     Review of Systems  Constitutional: Positive for activity change and appetite change. Negative for  chills.  HENT: Negative for congestion, rhinorrhea, sinus pressure and sore throat.   Eyes: Positive for photophobia. Negative for visual disturbance.  Respiratory: Negative for cough and shortness of breath.   Cardiovascular: Negative for chest pain.  Gastrointestinal: Positive for nausea and vomiting. Negative for abdominal pain, diarrhea, constipation and blood in stool.  Genitourinary: Positive for vaginal bleeding. Negative for dysuria, urgency, frequency, hematuria, flank pain, vaginal discharge, difficulty urinating, vaginal pain and dyspareunia.  Musculoskeletal: Negative for myalgias, back pain, gait problem, neck pain and neck stiffness.  Skin: Negative for rash.  Neurological: Positive for headaches. Negative for dizziness, syncope, facial asymmetry, speech difficulty, weakness and numbness.  All other systems reviewed and are negative.     Allergies  Orange fruit  Home Medications   Prior to Admission medications   Medication Sig Start Date End Date Taking? Authorizing Provider  acetaminophen (TYLENOL) 500 MG tablet Take 1,000 mg by mouth every 6 (six) hours as needed for headache.    Historical Provider, MD  metroNIDAZOLE (METROGEL VAGINAL) 0.75 % vaginal gel Place 1 Applicatorful vaginally 2 (two) times daily. 06/07/15   Montez MoritaMarie D Lawson, CNM  norgestimate-ethinyl estradiol (SPRINTEC 28) 0.25-35 MG-MCG tablet Take 1 tablet by mouth daily. Patient not taking: Reported on 06/07/2015 05/05/15   Judeth HornErin Lawrence, NP   BP 110/87 mmHg  Pulse 76  Temp(Src) 98.7 F (37.1 C)  Resp 16  Ht  (1.651 m)  Wt 60.867 kg  BMI 22.33 kg/m2  SpO2 100%  LMP 09/07/2015 Physical Exam  Constitutional: She is oriented to person, place, and time. She appears well-developed and well-nourished. No distress.  HENT:  Head: Normocephalic and atraumatic.  Right Ear: External ear normal.  Left Ear: External ear normal.  Nose: Nose normal.  Mouth/Throat: Oropharynx is clear and moist.  Eyes:  Conjunctivae and EOM are normal. Pupils are equal, round, and reactive to light.  Neck: Normal range of motion. Neck supple.  Cardiovascular: Normal rate, regular rhythm, normal heart sounds and intact distal pulses.   Pulmonary/Chest: Effort normal and breath sounds normal.  Abdominal: Soft. She exhibits no distension. There is no tenderness.  Musculoskeletal: She exhibits no edema or tenderness.  Neurological: She is alert and oriented to person, place, and time. She has normal strength. No cranial nerve deficit or sensory deficit. She displays a negative Romberg sign. Coordination and gait normal. GCS eye subscore is 4. GCS verbal subscore is 5. GCS motor subscore is 6.  Intact finger to nose and heel to shin  Skin: Skin is warm and dry. No rash noted. She is not diaphoretic.  Nursing note and vitals reviewed.   ED Course  Procedures (including critical care time) Labs Review Labs Reviewed  COMPREHENSIVE METABOLIC PANEL - Abnormal; Notable for the following:    ALT 11 (*)    All other components within normal limits  URINALYSIS, ROUTINE W REFLEX MICROSCOPIC (NOT AT [facility]) - Abnormal; Notable for the following:    Hgb urine dipstick MODERATE (*)    Leukocytes, UA TRACE (*)    All other components within normal limits  URINE MICROSCOPIC-ADD ON - Abnormal; Notable for the following:    Squamous Epithelial / LPF 0-5 (*)    Bacteria, UA FEW (*)    All other components within normal limits  LIPASE, BLOOD  CBC  POC URINE PREG, ED    Imaging Review No results found. I have personally reviewed and evaluated these images and lab results as part of my medical decision-making.   EKG Interpretation None      MDM  26 y.o. female with a hx of migraines presents to the ED noting a left sided migraine headache similar in character but worse than her previous migraines, unalleviated by [facility] powder. No hx of nor evidence of trauma. Physical exam, as above. She notes some radiation of her  predominantly temporal pain into her left face but shows no sensory or strength deficits. Neurologically intact and overall well appearing, laughing and smiling on examination. No meningeal signs of neck pain. Low suspicion for acute intracranial abnormality given reassuring exam and negative head CT in 2012. Do not feel that further advanced imaging is necessary at this time. She was given compazine and benadryl IM in the ED and had significant improvement in her sx. Labs were drawn in triage and luckily showed normal Hg, no leukocytosis, normal electrolytes, LFTs, and renal fxn. UA was negative for infection but showed evidence of reported menses. Preg neg. Given no other vaginal complaints other than irregular bleeding, with normal labs, do not feel that pelvic examination is necessary at this time. She was recommended to follow up with an OBGYN regarding her irregular periods and a PCP regarding her intermittent migraine headaches with the possibility of neurology referral in the future if they become more frequent and persistent. This plan was discussed with the patient and her significant other at  the bedside and they stated both understanding and agreement with this plan.  Final diagnoses:  None       Francoise Ceo, DO 09/08/15 1238  Glynn Octave, MD 09/08/15 [phone]

## 2015-09-21 DIAGNOSIS — D573 Sickle-cell trait: Secondary | ICD-10-CM | POA: Insufficient documentation

## 2015-09-21 DIAGNOSIS — G43109 Migraine with aura, not intractable, without status migrainosus: Secondary | ICD-10-CM | POA: Insufficient documentation

## 2015-09-29 ENCOUNTER — Encounter (HOSPITAL_COMMUNITY): Payer: Self-pay | Admitting: Family Medicine

## 2015-09-29 ENCOUNTER — Emergency Department (HOSPITAL_COMMUNITY)
Admission: EM | Admit: 2015-09-29 | Discharge: 2015-09-29 | Disposition: A | Discharge status: Home / Self Care | Payer: Self-pay | Attending: Emergency Medicine | Admitting: Emergency Medicine

## 2015-09-29 DIAGNOSIS — Z3202 Encounter for pregnancy test, result negative: Secondary | ICD-10-CM | POA: Insufficient documentation

## 2015-09-29 DIAGNOSIS — N939 Abnormal uterine and vaginal bleeding, unspecified: Secondary | ICD-10-CM | POA: Insufficient documentation

## 2015-09-29 DIAGNOSIS — R1084 Generalized abdominal pain: Secondary | ICD-10-CM | POA: Insufficient documentation

## 2015-09-29 DIAGNOSIS — Z8619 Personal history of other infectious and parasitic diseases: Secondary | ICD-10-CM | POA: Insufficient documentation

## 2015-09-29 LAB — COMPREHENSIVE METABOLIC PANEL
ALBUMIN: 4 g/dL (ref 3.5–5.0)
ALK PHOS: 65 U/L (ref 38–126)
ALT: 11 U/L — ABNORMAL LOW (ref 14–54)
ANION GAP: 9 (ref 5–15)
AST: 19 U/L (ref 15–41)
BUN: 14 mg/dL (ref 6–20)
CALCIUM: 9.7 mg/dL (ref 8.9–10.3)
CO2: 22 mmol/L (ref 22–32)
Chloride: 110 mmol/L (ref 101–111)
Creatinine, Ser: 1.01 mg/dL — ABNORMAL HIGH (ref 0.44–1.00)
GFR calc Af Amer: >60 mL/min (ref >60)
GFR calc non Af Amer: >60 mL/min (ref >60)
GLUCOSE: 94 mg/dL (ref 65–99)
Potassium: 3.9 mmol/L (ref 3.5–5.1)
SODIUM: 141 mmol/L (ref 135–145)
Total Bilirubin: 0.6 mg/dL (ref 0.3–1.2)
Total Protein: 6.9 g/dL (ref 6.5–8.1)

## 2015-09-29 LAB — LIPASE, BLOOD: Lipase: 23 U/L (ref 11–51)

## 2015-09-29 LAB — CBC
HEMATOCRIT: 38.4 % (ref 36.0–46.0)
Hemoglobin: 13.5 g/dL (ref 12.0–15.0)
MCH: 29.3 pg (ref 26.0–34.0)
MCHC: 35.2 g/dL (ref 30.0–36.0)
MCV: 83.3 fL (ref 78.0–100.0)
Platelets: 276 10*3/uL (ref 150–400)
RBC: 4.61 MIL/uL (ref 3.87–5.11)
RDW: 13 % (ref 11.5–15.5)
WBC: 8.1 10*3/uL (ref 4.0–10.5)

## 2015-09-29 LAB — I-STAT BETA HCG BLOOD, ED (MC, WL, AP ONLY): I-stat hCG, quantitative: <5 m[IU]/mL (ref <5)

## 2015-09-29 LAB — WET PREP, GENITAL
Sperm: NONE SEEN
Trich, Wet Prep: NONE SEEN
Yeast Wet Prep HPF POC: NONE SEEN

## 2015-09-29 MED ORDER — SODIUM CHLORIDE 0.9 % IV BOLUS (SEPSIS)
1000.0000 mL | Freq: Once | INTRAVENOUS | Status: AC
  Administered 2015-09-29: 1000 mL via INTRAVENOUS

## 2015-09-29 MED ORDER — MEGESTROL ACETATE 40 MG PO TABS: 40.0000 mg | ORAL_TABLET | Freq: Every day | ORAL | Status: DC

## 2015-09-29 NOTE — ED Provider Notes (Signed)
CSN: [id number]     Arrival date & time 09/29/15  1229 History   First MD Initiated Contact with Patient 09/29/15 1317     Chief Complaint  Patient presents with  . Vaginal Bleeding    HPI  Crystal Short is an 25 y.o. female with no significant PMH who presents to the ED for evaluation of vaginal bleeding for the past four days. She states that four the past four days she has had heavy vaginal bleeding, at times filling a pad and tampon within 10-30 minutes. She states she has had associated lower abdominal pain that feels like menstrual cramps. Of note she states she was treated for trichomoniasis with a single dose of flagyl and it was after taking the flagyl that her bleeding started. She states that prior to the past few days she has not had a menstrual cycle/any bleeding in over a year since she had a surgical abortion. Denies n/v/d. Denies urinary symptoms. Denies fever, chills. Denies feeling faint/lightheaded.  Past Medical History  Diagnosis Date  . Trichomonas infection    Past Surgical History  Procedure Laterality Date  . No past surgeries     Family History  Problem Relation Age of Onset  . Diabetes Mother   . Diabetes Brother   . Diabetes Maternal Grandmother    Social History  Substance Use Topics  . Smoking status: Never Smoker   . Smokeless tobacco: Never Used  . Alcohol Use: Yes     Comment: twice a week   OB History    Gravida Para Term Preterm AB TAB SAB Ectopic Multiple Living   0     Review of Systems  All other systems reviewed and are negative.     Allergies  Orange fruit  Home Medications   Prior to Admission medications   Medication Sig Start Date End Date Taking? Authorizing Provider  metroNIDAZOLE (METROGEL VAGINAL) 0.75 % vaginal gel Place 1 Applicatorful vaginally 2 (two) times daily. Patient not taking: Reported on 09/07/2015 06/07/15   Montez Morita, CNM  norgestimate-ethinyl estradiol (SPRINTEC 28) 0.25-35 MG-MCG  tablet Take 1 tablet by mouth daily. Patient not taking: Reported on 06/07/2015 05/05/15   Judeth Horn, NP   BP 112/80 mmHg  Pulse 60  Temp(Src) 97.6 F (36.4 C) (Oral)  Resp 18  SpO2 98%  LMP 09/07/2015 Physical Exam  Constitutional: She is oriented to person, place, and time.  HENT:  Right Ear: External ear normal.  Left Ear: External ear normal.  Nose: Nose normal.  Mouth/Throat: Oropharynx is clear and moist. No oropharyngeal exudate.  Eyes: Conjunctivae and EOM are normal. Pupils are equal, round, and reactive to light.  Neck: Normal range of motion. Neck supple.  Cardiovascular: Normal rate, regular rhythm, normal heart sounds and intact distal pulses.   Pulmonary/Chest: Effort normal and breath sounds normal. No respiratory distress. She has no wheezes. She exhibits no tenderness.  Abdominal: Soft. Bowel sounds are normal. She exhibits no distension. There is no tenderness.  Diffuse lower abdominal ttp. No guarding. Abdomen soft.  Genitourinary:  Dark red blood within vault. Bleeding appears to be coming from cervical os. No other masses. No CMT or adnexal tenderness.  Musculoskeletal: She exhibits no edema.  Neurological: She is alert and oriented to person, place, and time. No cranial nerve deficit.  Skin: Skin is warm and dry.  Psychiatric: She has a normal mood and affect.  Nursing note and vitals reviewed.  ED Course  Procedures (including critical care time) Labs Review Labs Reviewed  WET PREP, GENITAL - Abnormal; Notable for the following:    Clue Cells Wet Prep HPF POC PRESENT (*)    WBC, Wet Prep HPF POC MANY (*)    All other components within normal limits  COMPREHENSIVE METABOLIC PANEL - Abnormal; Notable for the following:    Creatinine, Ser 1.01 (*)    ALT 11 (*)    All other components within normal limits  CBC  LIPASE, BLOOD  I-STAT BETA HCG BLOOD, ED ([facility], [facility], [facility] ONLY)  GC/CHLAMYDIA PROBE AMP ([place] NOT [facility]    Imaging Review No  results found. I have personally reviewed and evaluated these images and lab results as part of my medical decision-making.   EKG Interpretation None      MDM   Final diagnoses:  Abnormal uterine bleeding   +bleeding on exam, though appears to be slow ooze. Some abdominal tenderness but no rebound or guarding, no GU tenderness. Wet prep with clue cells and WBC otherwise labs unremarkable. Pt without tachycardia or hypotension and is hemodynamically stable. I discussed pt with Dr. Ladean Rayaonstance of OB/GYN to facilitate outpatient f/u. Appreciate assistance. Will initiate megace 40mg  daily and give rx for one month supply. Pt to call [facility] outpatient clinic to schedule f/u within 2-4 weeks. ER return precautions given.    Carlene CoriaSerena Y Krystl Wickware, PA-C 09/30/15 1027  Vanetta MuldersScott Zackowski, MD 10/02/15 Aretha [id number]

## 2015-09-29 NOTE — ED Notes (Signed)
Pt here for vaginal bleeding since Monday. sts that she is soaking through pads in 10 minutes and seeing large clots

## 2015-09-29 NOTE — Discharge Instructions (Signed)
Your labs today were normal. Please call the [facility] to schedule a follow up appointment in 2-4 weeks. In the meantime you may take Megace 40mg  daily. This will help with your bleeding. Return to the ER for new or worsening symptoms.   Abnormal Uterine Bleeding Abnormal uterine bleeding can affect women at various stages in life, including teenagers, women in their reproductive years, pregnant women, and women who have reached menopause. Several kinds of uterine bleeding are considered abnormal, including:  Bleeding or spotting between periods.   Bleeding after sexual intercourse.   Bleeding that is heavier or more than normal.   Periods that last longer than usual.  Bleeding after menopause.  Many cases of abnormal uterine bleeding are minor and simple to treat, while others are more serious. Any type of abnormal bleeding should be evaluated by your health care provider. Treatment will depend on the cause of the bleeding. HOME CARE INSTRUCTIONS Monitor your condition for any changes. The following actions may help to alleviate any discomfort you are experiencing:  Avoid the use of tampons and douches as directed by your health care provider.  Change your pads frequently. You should get regular pelvic exams and Pap tests. Keep all follow-up appointments for diagnostic tests as directed by your health care provider.  SEEK MEDICAL CARE IF:   Your bleeding lasts more than 1 week.   You feel dizzy at times.  SEEK IMMEDIATE MEDICAL CARE IF:   You pass out.   You are changing pads every 15 to 30 minutes.   You have abdominal pain.  You have a fever.   You become sweaty or weak.   You are passing large blood clots from the vagina.   You start to feel nauseous and vomit. MAKE SURE YOU:   Understand these instructions.  Will watch your condition.  Will get help right away if you are not doing well or get worse.   This information is not intended  to replace advice given to you by your health care provider. Make sure you discuss any questions you have with your health care provider.   Document Released: 05/01/2005 Document Revised: 05/06/2013 Document Reviewed: 11/28/2012 Elsevier Interactive Patient Education [employer]2016 [employer]

## 2015-09-30 LAB — GC/CHLAMYDIA PROBE AMP (~~LOC~~) NOT AT ARMC
CHLAMYDIA, DNA PROBE: NEGATIVE
NEISSERIA GONORRHEA: NEGATIVE

## 2015-12-17 ENCOUNTER — Emergency Department (HOSPITAL_COMMUNITY)
Admission: EM | Admit: 2015-12-17 | Discharge: 2015-12-17 | Disposition: A | Discharge status: Home / Self Care | Payer: Self-pay | Attending: Emergency Medicine | Admitting: Emergency Medicine

## 2015-12-17 ENCOUNTER — Encounter (HOSPITAL_COMMUNITY): Payer: Self-pay | Admitting: Emergency Medicine

## 2015-12-17 DIAGNOSIS — J029 Acute pharyngitis, unspecified: Secondary | ICD-10-CM | POA: Insufficient documentation

## 2015-12-17 DIAGNOSIS — Z7982 Long term (current) use of aspirin: Secondary | ICD-10-CM | POA: Insufficient documentation

## 2015-12-17 LAB — RAPID STREP SCREEN (MED CTR MEBANE ONLY): STREPTOCOCCUS, GROUP A SCREEN (DIRECT): NEGATIVE

## 2015-12-17 MED ORDER — DEXAMETHASONE SODIUM PHOSPHATE 10 MG/ML IJ SOLN
10.0000 mg | Freq: Once | INTRAMUSCULAR | Status: AC
  Administered 2015-12-17: 10 mg via INTRAMUSCULAR
  Filled 2015-12-17: qty 1

## 2015-12-17 MED ORDER — KETOROLAC TROMETHAMINE 60 MG/2ML IM SOLN
60.0000 mg | Freq: Once | INTRAMUSCULAR | Status: AC
  Administered 2015-12-17: 60 mg via INTRAMUSCULAR
  Filled 2015-12-17: qty 2

## 2015-12-17 NOTE — ED Provider Notes (Signed)
[facility]-EMERGENCY DEPT Provider Note  CSN: [id number] Arrival date & time: 12/17/15  1138  First Provider Contact:   First MD Initiated Contact with Patient 12/17/15 1241      By signing my name below, I, Essence Howell, attest that this documentation has been prepared under the direction and in the presence of Arthor Captain, PA-C Electronically Signed: Charline Bills, ED Scribe 12/17/2015 at 12:57 PM.  History   Chief Complaint Chief Complaint  Patient presents with  . Sore Throat    HPI Crystal Short is a 25 y.o. female who presents to the Emergency Department complaining of gradually worsening sore throat for the past 2 days. Pt reports increased pain with swallowing. She also reports associated symptoms of fever, chills and generalized body aches. Triage temperature of 99.1 F. She has tried [facility] powder without significant relief.   The history is provided by the patient. No language interpreter was used.    Past Medical History:  Diagnosis Date  . Trichomonas infection     There are no active problems to display for this patient.   Past Surgical History:  Procedure Laterality Date  . NO PAST SURGERIES      OB History    Gravida Para Term Preterm AB Living   1       1 0   SAB TAB Ectopic Multiple Live Births   1              Home Medications    Prior to Admission medications   Medication Sig Start Date End Date Taking? Authorizing Provider  Aspirin-Salicylamide-Caffeine (BC HEADACHE POWDER PO) Take 1 Package by mouth 2 (two) times daily as needed (pain).    Historical Provider, MD  megestrol (MEGACE) 40 MG tablet Take 1 tablet (40 mg total) by mouth daily. 09/29/15   Carlene Coria, PA-C  norgestimate-ethinyl estradiol (SPRINTEC 28) 0.25-35 MG-MCG tablet Take 1 tablet by mouth daily. Patient not taking: Reported on 06/07/2015 05/05/15   Judeth Horn, NP    Family History Family History  Problem Relation Age of Onset  . Diabetes Mother   . Diabetes Brother   .  Diabetes Maternal Grandmother     Social History Social History  Substance Use Topics  . Smoking status: Never Smoker  . Smokeless tobacco: Never Used  . Alcohol use Yes     Comment: twice a week    Allergies   Orange fruit [citrus]   Review of Systems Review of Systems  Constitutional: Positive for chills and fever.  HENT: Positive for sore throat.   Musculoskeletal: Positive for myalgias.    Physical Exam Updated Vital Signs BP 109/88 (BP Location: Left Arm)   Pulse 104   Temp 99.1 F (37.3 C) (Oral)   Resp 14   Ht 5\' 5"  (1.651 m)   Wt 127 lb 2 oz (57.7 kg)   SpO2 100%   BMI 21.15 kg/m   Physical Exam  Constitutional: She is oriented to person, place, and time. She appears well-developed and well-nourished. No distress.  HENT:  Head: Normocephalic and atraumatic.  Mouth/Throat: Posterior oropharyngeal edema and posterior oropharyngeal erythema present. No oropharyngeal exudate.  Eyes: Conjunctivae and EOM are normal.  Neck: Neck supple. No tracheal deviation present.  Cardiovascular: Normal rate.   Pulmonary/Chest: Effort normal. No respiratory distress.  Musculoskeletal: Normal range of motion.  Neurological: She is alert and oriented to person, place, and time.  Skin: Skin is warm and dry.  Psychiatric: She has a normal mood and  affect. Her behavior is normal.  Nursing note and vitals reviewed.   ED Treatments / Results  Labs (all labs ordered are listed, but only abnormal results are displayed) Labs Reviewed  RAPID STREP SCREEN (NOT AT [facility])  CULTURE, GROUP A STREP [facility])    EKG  EKG Interpretation None       Radiology No results found.  Procedures Procedures (including critical care time) DIAGNOSTIC STUDIES: Oxygen Saturation is 100% on RA, normal by my interpretation.    COORDINATION OF CARE: 12:48 PM-Discussed treatment plan which includes strep screen, decadron and Toradol injections with pt at bedside and pt agreed to plan.    Medications Ordered in ED Medications - No data to display  Initial Impression / Assessment and Plan / ED Course  I have reviewed the triage vital signs and the nursing notes.  Pertinent labs & imaging results that were available during my care of the patient were reviewed by me and considered in my medical decision making (see chart for details).  Clinical Course    I personally performed the services described in this documentation, which was scribed in my presence. The recorded information has been reviewed and is accurate.    Pt afebrile without tonsillar exudate, negative strep. Presents with mild cervical lymphadenopathy, & dysphagia; diagnosis of viral pharyngitis. No abx indicated. DC w symptomatic tx for pain  Pt does not appear dehydrated, but did discuss importance of water rehydration. Presentation non concerning for PTA or infxn spread to soft tissue. No trismus or uvula deviation. Specific return precautions discussed. Pt able to drink water in ED without difficulty with intact air way. Recommended PCP follow up.  Final Clinical Impressions(s) / ED Diagnoses   Final diagnoses:  Viral pharyngitis    New Prescriptions New Prescriptions   No medications on file     Arthor Captain, PA-C 12/17/15 1322    Shaune Pollack, MD 12/17/15 1659

## 2015-12-17 NOTE — ED Notes (Signed)
C/o sore throat, bodyaches and chills x 2 days. States has been unable to eat or drink.

## 2015-12-17 NOTE — ED Triage Notes (Signed)
Pt sts sore throat and fever x 2 days  

## 2015-12-19 LAB — CULTURE, GROUP A STREP (THRC)

## 2016-03-03 ENCOUNTER — Emergency Department: Payer: No Typology Code available for payment source

## 2016-03-03 ENCOUNTER — Encounter: Payer: Self-pay | Admitting: Emergency Medicine

## 2016-03-03 ENCOUNTER — Emergency Department
Admission: EM | Admit: 2016-03-03 | Discharge: 2016-03-03 | Disposition: A | Discharge status: Home / Self Care | Payer: No Typology Code available for payment source | Attending: Emergency Medicine | Admitting: Emergency Medicine

## 2016-03-03 DIAGNOSIS — Y9389 Activity, other specified: Secondary | ICD-10-CM | POA: Diagnosis not present

## 2016-03-03 DIAGNOSIS — S161XXA Strain of muscle, fascia and tendon at neck level, initial encounter: Secondary | ICD-10-CM

## 2016-03-03 DIAGNOSIS — S0990XA Unspecified injury of head, initial encounter: Secondary | ICD-10-CM | POA: Diagnosis not present

## 2016-03-03 DIAGNOSIS — Z79899 Other long term (current) drug therapy: Secondary | ICD-10-CM | POA: Diagnosis not present

## 2016-03-03 DIAGNOSIS — S20219A Contusion of unspecified front wall of thorax, initial encounter: Secondary | ICD-10-CM | POA: Insufficient documentation

## 2016-03-03 DIAGNOSIS — S199XXA Unspecified injury of neck, initial encounter: Secondary | ICD-10-CM | POA: Diagnosis present

## 2016-03-03 DIAGNOSIS — Y999 Unspecified external cause status: Secondary | ICD-10-CM | POA: Diagnosis not present

## 2016-03-03 DIAGNOSIS — Z7982 Long term (current) use of aspirin: Secondary | ICD-10-CM | POA: Diagnosis not present

## 2016-03-03 DIAGNOSIS — Y92410 Unspecified street and highway as the place of occurrence of the external cause: Secondary | ICD-10-CM | POA: Diagnosis not present

## 2016-03-03 MED ORDER — KETOROLAC TROMETHAMINE 30 MG/ML IJ SOLN
30.0000 mg | Freq: Once | INTRAMUSCULAR | Status: AC
  Administered 2016-03-03: 30 mg via INTRAMUSCULAR
  Filled 2016-03-03: qty 1

## 2016-03-03 MED ORDER — CYCLOBENZAPRINE HCL 10 MG PO TABS: 10.0000 mg | ORAL_TABLET | Freq: Three times a day (TID) | ORAL | 0 refills | Status: DC | PRN

## 2016-03-03 MED ORDER — IBUPROFEN 600 MG PO TABS: 600.0000 mg | ORAL_TABLET | Freq: Four times a day (QID) | ORAL | 0 refills | Status: DC | PRN
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
## 2016-03-04 ENCOUNTER — Emergency Department
Admission: EM | Admit: 2016-03-04 | Discharge: 2016-03-04 | Disposition: A | Discharge status: Home / Self Care | Payer: No Typology Code available for payment source | Attending: Emergency Medicine | Admitting: Emergency Medicine

## 2016-03-04 ENCOUNTER — Encounter: Payer: Self-pay | Admitting: Emergency Medicine

## 2016-03-04 DIAGNOSIS — R51 Headache: Secondary | ICD-10-CM | POA: Insufficient documentation

## 2016-03-04 DIAGNOSIS — F0781 Postconcussional syndrome: Secondary | ICD-10-CM | POA: Insufficient documentation

## 2016-03-04 DIAGNOSIS — Z79899 Other long term (current) drug therapy: Secondary | ICD-10-CM | POA: Diagnosis not present

## 2016-03-04 DIAGNOSIS — Z791 Long term (current) use of non-steroidal anti-inflammatories (NSAID): Secondary | ICD-10-CM | POA: Diagnosis not present

## 2016-03-04 DIAGNOSIS — G44309 Post-traumatic headache, unspecified, not intractable: Secondary | ICD-10-CM

## 2016-03-04 DIAGNOSIS — Z7982 Long term (current) use of aspirin: Secondary | ICD-10-CM | POA: Insufficient documentation

## 2016-03-04 MED ORDER — KETOROLAC TROMETHAMINE 30 MG/ML IJ SOLN
15.0000 mg | Freq: Once | INTRAMUSCULAR | Status: DC
  Filled 2016-03-04: qty 1

## 2016-03-04 MED ORDER — PROCHLORPERAZINE EDISYLATE 5 MG/ML IJ SOLN
10.0000 mg | Freq: Once | INTRAMUSCULAR | Status: AC
  Administered 2016-03-04: 10 mg via INTRAVENOUS
  Filled 2016-03-04: qty 2

## 2016-03-04 MED ORDER — SODIUM CHLORIDE 0.9 % IV BOLUS (SEPSIS)
1000.0000 mL | Freq: Once | INTRAVENOUS | Status: AC
  Administered 2016-03-04: 1000 mL via INTRAVENOUS
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
## 2016-03-04 NOTE — ED Notes (Signed)
Pt awakened for iv placement. Informed of the medications ordered. When this nurse ready to give toradol pt than asked if it was the medicine she received yesterday and if so she doesn't want it. Accepted the compazine once informed that we use if for migraines.
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
## 2016-08-26 ENCOUNTER — Emergency Department: Payer: Self-pay

## 2016-08-26 ENCOUNTER — Encounter: Payer: Self-pay | Admitting: Emergency Medicine

## 2016-08-26 ENCOUNTER — Emergency Department
Admission: EM | Admit: 2016-08-26 | Discharge: 2016-08-26 | Disposition: A | Discharge status: Home / Self Care | Payer: Self-pay | Attending: Emergency Medicine | Admitting: Emergency Medicine

## 2016-08-26 DIAGNOSIS — N939 Abnormal uterine and vaginal bleeding, unspecified: Secondary | ICD-10-CM

## 2016-08-26 DIAGNOSIS — R102 Pelvic and perineal pain: Secondary | ICD-10-CM

## 2016-08-26 DIAGNOSIS — N938 Other specified abnormal uterine and vaginal bleeding: Secondary | ICD-10-CM

## 2016-08-26 LAB — CBC
HCT: 41.1 % (ref 35.0–47.0)
HEMOGLOBIN: 13.8 g/dL (ref 12.0–16.0)
MCH: 28 pg (ref 26.0–34.0)
MCHC: 33.6 g/dL (ref 32.0–36.0)
MCV: 83.3 fL (ref 80.0–100.0)
PLATELETS: 329 10*3/uL (ref 150–440)
RBC: 4.94 MIL/uL (ref 3.80–5.20)
RDW: 13.7 % (ref 11.5–14.5)
WBC: 8.6 10*3/uL (ref 3.6–11.0)

## 2016-08-26 LAB — URINALYSIS, COMPLETE (UACMP) WITH MICROSCOPIC
Bilirubin Urine: NEGATIVE
Glucose, UA: NEGATIVE mg/dL
KETONES UR: 20 mg/dL — AB
Leukocytes, UA: NEGATIVE
NITRITE: NEGATIVE
PH: 6 (ref 5.0–8.0)
PROTEIN: NEGATIVE mg/dL
Specific Gravity, Urine: 1.031 — ABNORMAL HIGH (ref 1.005–1.030)

## 2016-08-26 LAB — CHLAMYDIA/NGC RT PCR (ARMC ONLY)
Chlamydia Tr: NOT DETECTED
N gonorrhoeae: NOT DETECTED

## 2016-08-26 LAB — COMPREHENSIVE METABOLIC PANEL
ALK PHOS: 68 U/L (ref 38–126)
ALT: 12 U/L — AB (ref 14–54)
ANION GAP: 7 (ref 5–15)
AST: 25 U/L (ref 15–41)
Albumin: 4.6 g/dL (ref 3.5–5.0)
BILIRUBIN TOTAL: 1.1 mg/dL (ref 0.3–1.2)
BUN: 15 mg/dL (ref 6–20)
CALCIUM: 10.3 mg/dL (ref 8.9–10.3)
CO2: 24 mmol/L (ref 22–32)
CREATININE: 0.83 mg/dL (ref 0.44–1.00)
Chloride: 106 mmol/L (ref 101–111)
Glucose, Bld: 79 mg/dL (ref 65–99)
Potassium: 3.6 mmol/L (ref 3.5–5.1)
Sodium: 137 mmol/L (ref 135–145)
Total Protein: 8.1 g/dL (ref 6.5–8.1)

## 2016-08-26 LAB — TYPE AND SCREEN
ABO/RH(D): B POS
Antibody Screen: NEGATIVE

## 2016-08-26 LAB — WET PREP, GENITAL
CLUE CELLS WET PREP: NONE SEEN
Sperm: NONE SEEN
TRICH WET PREP: NONE SEEN
YEAST WET PREP: NONE SEEN

## 2016-08-26 LAB — HCG, QUANTITATIVE, PREGNANCY

## 2016-08-26 LAB — POCT PREGNANCY, URINE: PREG TEST UR: NEGATIVE

## 2016-08-26 LAB — LIPASE, BLOOD: Lipase: 24 U/L (ref 11–51)

## 2016-08-26 MED ORDER — SODIUM CHLORIDE 0.9 % IV BOLUS (SEPSIS)
1000.0000 mL | Freq: Once | INTRAVENOUS | Status: AC
  Administered 2016-08-26: 1000 mL via INTRAVENOUS

## 2016-08-26 MED ORDER — ONDANSETRON HCL 4 MG/2ML IJ SOLN
4.0000 mg | Freq: Once | INTRAMUSCULAR | Status: AC
  Administered 2016-08-26: 4 mg via INTRAVENOUS
  Filled 2016-08-26: qty 2

## 2016-08-26 MED ORDER — IOPAMIDOL (ISOVUE-300) INJECTION 61%
30.0000 mL | Freq: Once | INTRAVENOUS | Status: AC | PRN
  Administered 2016-08-26: 30 mL via ORAL

## 2016-08-26 MED ORDER — MORPHINE SULFATE (PF) 4 MG/ML IV SOLN
4.0000 mg | Freq: Once | INTRAVENOUS | Status: AC
Start: 2016-08-26 — End: 2016-08-26
  Administered 2016-08-26: 4 mg via INTRAVENOUS
  Filled 2016-08-26: qty 1

## 2016-08-26 MED ORDER — IOPAMIDOL (ISOVUE-300) INJECTION 61%
100.0000 mL | Freq: Once | INTRAVENOUS | Status: AC | PRN
  Administered 2016-08-26: 100 mL via INTRAVENOUS
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
## 2016-08-26 NOTE — ED Provider Notes (Signed)
[facility] Emergency Department Provider Note  ____________________________________________   First MD Initiated Contact with Patient 08/26/16 1558     (approximate)  I have reviewed the triage vital signs and the nursing notes.   HISTORY  Chief Complaint Vaginal Bleeding   HPI Crystal Short is a 26 y.o. female with a history of trichomonas was approximately [redacted] weeks pregnant with her first pregnancy was presenting to the emergency department today with syncope as well as right pelvic pain. She says that when she woke up this morning she had right lower quadrant abdominal pain which she describes as an 11 out of 10. She says the pain is cramping and constant. She said that she also passed out after getting up out of bed and had lightheadedness prior to passing out. She is denying any chest pain or shortness of breath. Says that she has soaked 3 pads and is passing clots today from her vaginal bleeding.   Past Medical History:  Diagnosis Date  . Trichomonas infection     There are no active problems to display for this patient.   Past Surgical History:  Procedure Laterality Date  . NO PAST SURGERIES      Prior to Admission medications   Medication Sig Start Date End Date Taking? Authorizing Provider  cyclobenzaprine (FLEXERIL) 10 MG tablet Take 1 tablet (10 mg total) by mouth 3 (three) times daily as needed for muscle spasms. Patient not taking: Reported on 08/26/2016 03/03/16   Jami L Hagler, PA-C  ibuprofen (ADVIL,MOTRIN) 600 MG tablet Take 1 tablet (600 mg total) by mouth every 6 (six) hours as needed. Patient not taking: Reported on 08/26/2016 03/03/16   Jami L Hagler, PA-C  megestrol (MEGACE) 40 MG tablet Take 1 tablet (40 mg total) by mouth daily. Patient not taking: Reported on 08/26/2016 09/29/15   Ace Gins Sam, PA-C  norgestimate-ethinyl estradiol (SPRINTEC 28) 0.25-35 MG-MCG tablet Take 1 tablet by mouth daily. Patient not taking: Reported  on 06/07/2015 05/05/15   Judeth Horn, NP    Allergies Orange fruit [citrus]  Family History  Problem Relation Age of Onset  . Diabetes Mother   . Diabetes Brother   . Diabetes Maternal Grandmother     Social History Social History  Substance Use Topics  . Smoking status: Never Smoker  . Smokeless tobacco: Never Used  . Alcohol use Yes     Comment: twice a week    Review of Systems Constitutional: No fever/chills Eyes: No visual changes. ENT: No sore throat. Cardiovascular: Denies chest pain. Respiratory: Denies shortness of breath. Gastrointestinal:  No diarrhea.  No constipation. Genitourinary: Negative for dysuria. Musculoskeletal: Negative for back pain. Skin: Negative for rash. Neurological: Negative for headaches, focal weakness or numbness.  10-point ROS otherwise negative.  ____________________________________________   PHYSICAL EXAM:  VITAL SIGNS: ED Triage Vitals  Enc Vitals Group     BP 08/26/16 1535 115/81     Pulse Rate 08/26/16 1535 76     Resp 08/26/16 1535 (!) 24     Temp --      Temp src --      SpO2 08/26/16 1535 94 %     Weight 08/26/16 1536 125 lb (56.7 kg)     Height 08/26/16 1536  (1.651 m)     Head Circumference --      Peak Flow --      Pain Score 08/26/16 1535 10     Pain Loc --  Pain Edu? --      Excl. in GC? --     Constitutional: Alert and oriented. Appears uncomfortable.  Eyes: Conjunctivae are normal. PERRL. EOMI. Head: Atraumatic. Nose: No congestion/rhinnorhea. Mouth/Throat: Mucous membranes are moist.   Neck: No stridor.   Cardiovascular: Normal rate, regular rhythm. Grossly normal heart sound   Respiratory: Normal respiratory effort.  No retractions. Lungs CTAB. Gastrointestinal: Soft With suprapubic and right lower quadrant tenderness which is moderate. No rebound or guarding. Genitourinary:  Small ulcerated lesion at the right lower labia majora which the patient says is not painful. She said that she  may have nicked herself while shaving. Speculum exam with a small amount of blood which is maroon in the vault. No active bleeding from the cervix. Bimanual exam with positive CMT. Positive for right adnexal tenderness without palpable mass. No uterine or left adnexal tenderness nor mass. Musculoskeletal: No lower extremity tenderness nor edema.  No joint effusions. Neurologic:  Normal speech and language. No gross focal neurologic deficits are appreciated.  Skin:  Skin is warm, dry and intact. No rash noted. Psychiatric: Mood and affect are normal. Speech and behavior are normal.  ____________________________________________   LABS (all labs ordered are listed, but only abnormal results are displayed)  Labs Reviewed  WET PREP, GENITAL - Abnormal; Notable for the following:       Result Value   WBC, Wet Prep HPF POC FEW (*)    All other components within normal limits  COMPREHENSIVE METABOLIC PANEL - Abnormal; Notable for the following:    ALT 12 (*)    All other components within normal limits  URINALYSIS, COMPLETE (UACMP) WITH MICROSCOPIC - Abnormal; Notable for the following:    Color, Urine YELLOW (*)    APPearance CLOUDY (*)    Specific Gravity, Urine 1.031 (*)    Hgb urine dipstick LARGE (*)    Ketones, ur 20 (*)    Bacteria, UA RARE (*)    Squamous Epithelial / LPF 6-30 (*)    All other components within normal limits  CHLAMYDIA/NGC RT PCR ([facility] ONLY)  LIPASE, BLOOD  CBC  HCG, QUANTITATIVE, PREGNANCY  POC URINE PREG, ED  POCT PREGNANCY, URINE  TYPE AND SCREEN   ____________________________________________  EKG  ED ECG REPORT I, Arelia Longest, the attending physician, personally viewed and interpreted this ECG.   Date: 08/26/2016  EKG Time: 1544  Rate: 64  Rhythm: normal sinus rhythm  Axis: Normal  Intervals:none  ST&T Change: No ST segment elevation or depression. No abnormal T-wave  inversion.  ____________________________________________  RADIOLOGY  CT Abdomen Pelvis W Contrast (Final result)  Result time 08/26/16 19:52:19  Final result by Alcide Clever, MD (08/26/16 19:52:19)           Narrative:   CLINICAL DATA: Vaginal bleeding, history of positive pregnancy test previously  EXAM: CT ABDOMEN AND PELVIS WITH CONTRAST  TECHNIQUE: Multidetector CT imaging of the abdomen and pelvis was performed using the standard protocol following bolus administration of intravenous contrast.  CONTRAST: ISOVUE-300 IOPAMIDOL (ISOVUE-300) INJECTION 61%  COMPARISON: Ultrasound from earlier in the same day  FINDINGS: Lower chest: No acute abnormality.  Hepatobiliary: No focal liver abnormality is seen. No gallstones, gallbladder wall thickening, or biliary dilatation.  Pancreas: Unremarkable. No pancreatic ductal dilatation or surrounding inflammatory changes.  Spleen: Normal in size without focal abnormality.  Adrenals/Urinary Tract: Adrenal glands are unremarkable. Kidneys are normal, without renal calculi, focal lesion, or hydronephrosis. Bladder is unremarkable.  Stomach/Bowel: Stomach is  within normal limits. Appendix appears normal. No evidence of bowel wall thickening, distention, or inflammatory changes.  Vascular/Lymphatic: No significant vascular findings are present. No enlarged abdominal or pelvic lymph nodes.  Reproductive: Uterus and bilateral adnexa are unremarkable.  Other: Minimal free pelvic fluid is noted similar to that seen on the prior ultrasound. This is likely physiologic in nature.  Musculoskeletal: No acute or significant osseous findings.  IMPRESSION: No acute abnormality noted.   Electronically Signed By: Alcide Clever M.D. On: 08/26/2016 19:52            [place] Art/Ven Flow Abd Pelv Doppler (Final result)  Result time 08/26/16 17:53:31  Final result by Harmon Pier, MD (08/26/16 17:53:31)            Narrative:   CLINICAL DATA: 26 year old female with right pelvic pain and vaginal bleeding for 1 day.  EXAM: TRANSABDOMINAL AND TRANSVAGINAL ULTRASOUND OF PELVIS  DOPPLER ULTRASOUND OF OVARIES  TECHNIQUE: Both transabdominal and transvaginal ultrasound examinations of the pelvis were performed. Transabdominal technique was performed for global imaging of the pelvis including uterus, ovaries, adnexal regions, and pelvic cul-de-sac.  It was necessary to proceed with endovaginal exam following the transabdominal exam to visualize the ovaries and endometrium. Color and duplex Doppler ultrasound was utilized to evaluate blood flow to the ovaries.  COMPARISON: 10/15/2010 ultrasound  FINDINGS: Uterus  Measurements: 7.8 x 3.6 x 5.2 cm and anteverted. No fibroids or other mass visualized.  Endometrium  Thickness: 1.3 mm. No focal abnormality visualized.  Right ovary  Measurements: 3.5 x 1.7 x 2.4 cm. Normal appearance/no suspicious adnexal mass.  Left ovary  Measurements: 3.8 x 2.4 x 2.7 cm. Normal appearance/no suspicious adnexal mass.  Pulsed Doppler evaluation of both ovaries demonstrates normal low-resistance arterial and venous waveforms.  Other findings  A small amount of free fluid may be physiologic.  IMPRESSION: Small amount of free fluid which may be physiologic.  No other significant abnormalities.   Electronically Signed By: Harmon Pier M.D. On: 08/26/2016 17:53            US Pelvis Complete (Final result)  Result time 08/26/16 17:53:31  Final result by Harmon Pier, MD (08/26/16 17:53:31)           Narrative:   CLINICAL DATA: 26 year old female with right pelvic pain and vaginal bleeding for 1 day.  EXAM: TRANSABDOMINAL AND TRANSVAGINAL ULTRASOUND OF PELVIS  DOPPLER ULTRASOUND OF OVARIES  TECHNIQUE: Both transabdominal and transvaginal ultrasound examinations of the pelvis were performed. Transabdominal technique was  performed for global imaging of the pelvis including uterus, ovaries, adnexal regions, and pelvic cul-de-sac.  It was necessary to proceed with endovaginal exam following the transabdominal exam to visualize the ovaries and endometrium. Color and duplex Doppler ultrasound was utilized to evaluate blood flow to the ovaries.  COMPARISON: 10/15/2010 ultrasound  FINDINGS: Uterus  Measurements: 7.8 x 3.6 x 5.2 cm and anteverted. No fibroids or other mass visualized.  Endometrium  Thickness: 1.3 mm. No focal abnormality visualized.  Right ovary  Measurements: 3.5 x 1.7 x 2.4 cm. Normal appearance/no suspicious adnexal mass.  Left ovary  Measurements: 3.8 x 2.4 x 2.7 cm. Normal appearance/no suspicious adnexal mass.  Pulsed Doppler evaluation of both ovaries demonstrates normal low-resistance arterial and venous waveforms.  Other findings  A small amount of free fluid may be physiologic.  IMPRESSION: Small amount of free fluid which may be physiologic.  No other significant abnormalities.   Electronically Signed By: Harmon Pier M.D. On: 08/26/2016 17:53  US Transvaginal Non-OB (Final result)  Result time 08/26/16 17:53:31  Final result by Harmon Pier, MD (08/26/16 17:53:31)           Narrative:   CLINICAL DATA: 26 year old female with right pelvic pain and vaginal bleeding for 1 day.  EXAM: TRANSABDOMINAL AND TRANSVAGINAL ULTRASOUND OF PELVIS  DOPPLER ULTRASOUND OF OVARIES  TECHNIQUE: Both transabdominal and transvaginal ultrasound examinations of the pelvis were performed. Transabdominal technique was performed for global imaging of the pelvis including uterus, ovaries, adnexal regions, and pelvic cul-de-sac.  It was necessary to proceed with endovaginal exam following the transabdominal exam to visualize the ovaries and endometrium. Color and duplex Doppler ultrasound was utilized to evaluate blood flow to the  ovaries.  COMPARISON: 10/15/2010 ultrasound  FINDINGS: Uterus  Measurements: 7.8 x 3.6 x 5.2 cm and anteverted. No fibroids or other mass visualized.  Endometrium  Thickness: 1.3 mm. No focal abnormality visualized.  Right ovary  Measurements: 3.5 x 1.7 x 2.4 cm. Normal appearance/no suspicious adnexal mass.  Left ovary  Measurements: 3.8 x 2.4 x 2.7 cm. Normal appearance/no suspicious adnexal mass.  Pulsed Doppler evaluation of both ovaries demonstrates normal low-resistance arterial and venous waveforms.  Other findings  A small amount of free fluid may be physiologic.  IMPRESSION: Small amount of free fluid which may be physiologic.  No other significant abnormalities.   Electronically Signed By: Harmon Pier M.D. On: 08/26/2016 17:53          ____________________________________________   PROCEDURES  Procedure(s) performed:   Procedures  Critical Care performed:   ____________________________________________   INITIAL IMPRESSION / ASSESSMENT AND PLAN / ED COURSE  Pertinent labs & imaging results that were available during my care of the patient were reviewed by me and considered in my medical decision making (see chart for details).  ----------------------------------------- 9:33 PM on 08/26/2016 -----------------------------------------  Patient was counseled at this time. Very reassuring workup. We'll send urine for culture. Possibly missed abortion. Counseled the patient about the possibilities of these diagnoses and that her pregnancy test came back negative and of the ultrasound as well as CAT scan did not show any evidence of ectopic. She'll be following up with OB/GYN on call. Will be discharged home at this time. Resting comfortably at this time without any objective signs of pain.      ____________________________________________   FINAL CLINICAL IMPRESSION(S) / ED DIAGNOSES  Final diagnoses:  Vaginal bleeding   dysfunctional uterine bleeding.    NEW MEDICATIONS STARTED DURING THIS VISIT:  New Prescriptions   No medications on file     Note:  This document was prepared using Dragon voice recognition software and may include unintentional dictation errors.    Myrna Blazer, MD 08/26/16 [phone]

## 2016-08-26 NOTE — ED Notes (Signed)
Pt in u/s

## 2016-08-26 NOTE — ED Notes (Signed)
Pt reminded that we need a urine.

## 2016-09-15 ENCOUNTER — Emergency Department
Admission: EM | Admit: 2016-09-15 | Discharge: 2016-09-15 | Disposition: A | Discharge status: Home / Self Care | Payer: Self-pay | Attending: Student in an Organized Health Care Education/Training Program | Admitting: Student in an Organized Health Care Education/Training Program

## 2016-09-15 ENCOUNTER — Encounter: Payer: Self-pay | Admitting: Emergency Medicine

## 2016-09-15 DIAGNOSIS — A510 Primary genital syphilis: Secondary | ICD-10-CM | POA: Insufficient documentation

## 2016-09-15 DIAGNOSIS — A539 Syphilis, unspecified: Secondary | ICD-10-CM

## 2016-09-15 DIAGNOSIS — B9689 Other specified bacterial agents as the cause of diseases classified elsewhere: Secondary | ICD-10-CM

## 2016-09-15 DIAGNOSIS — N760 Acute vaginitis: Secondary | ICD-10-CM | POA: Insufficient documentation

## 2016-09-15 HISTORY — DX: Syphilis, unspecified: A53.9

## 2016-09-15 LAB — URINALYSIS, COMPLETE (UACMP) WITH MICROSCOPIC
Bilirubin Urine: NEGATIVE
Glucose, UA: NEGATIVE mg/dL
Hgb urine dipstick: NEGATIVE
Ketones, ur: NEGATIVE mg/dL
NITRITE: NEGATIVE
PROTEIN: 30 mg/dL — AB
SPECIFIC GRAVITY, URINE: 1.016 (ref 1.005–1.030)
pH: 7 (ref 5.0–8.0)

## 2016-09-15 LAB — CHLAMYDIA/NGC RT PCR (ARMC ONLY)
CHLAMYDIA TR: NOT DETECTED
N gonorrhoeae: NOT DETECTED

## 2016-09-15 LAB — WET PREP, GENITAL
SPERM: NONE SEEN
Trich, Wet Prep: NONE SEEN
WBC WET PREP: NONE SEEN
Yeast Wet Prep HPF POC: NONE SEEN

## 2016-09-15 LAB — POCT PREGNANCY, URINE: Preg Test, Ur: NEGATIVE

## 2016-09-15 MED ORDER — PENICILLIN G BENZATHINE 1200000 UNIT/2ML IM SUSP
2.4000 10*6.[IU] | Freq: Once | INTRAMUSCULAR | Status: AC
  Administered 2016-09-15: 2.4 10*6.[IU] via INTRAMUSCULAR
  Filled 2016-09-15: qty 4

## 2016-09-15 MED ORDER — METRONIDAZOLE 500 MG PO TABS: 500.0000 mg | ORAL_TABLET | Freq: Two times a day (BID) | ORAL | 0 refills | Status: AC

## 2016-09-15 NOTE — Discharge Instructions (Signed)
You have treated for an exposure to syphilis. You are also be treated for bacterial vaginosis. BV is not considered a sexually transmitted infection. You MUST follow-up with the health department in 6 & 12 month for repeat blood tests. Avoid any unprotected sexual contact until your symptoms are resolved and you have had blood tests for HIV and herpes.  Any & all sexual contacts should be tested and treated as needed.

## 2016-09-15 NOTE — ED Triage Notes (Signed)
Pt comes into the ED via POV c/o a possible STD.  Patient states her ex boyfriend was here yesterday and was diagnosed with trich.  Patient in NAD at this time with even and unlabored respirations.  Patient states she does has white vaginal discharge but no other symptoms present.

## 2016-09-15 NOTE — ED Provider Notes (Signed)
[facility] Emergency Department Provider Note ____________________________________________  Time seen: 1557  I have reviewed the triage vital signs and the nursing notes.  HISTORY  Chief Complaint  SEXUALLY TRANSMITTED DISEASE  HPI Crystal Short is a 26 y.o. female presents to the ED for treatment after exposure to syphilis. She reports being notified by her ex-boyfriend that he was treated here yesterday for syphilis. She also notes a vaginal discharge. She denies any fevers, pelvic pain, nausea, or weight loss. She reports sexual contact within the last week. She is unaware of any painless ulcers, palm or sole lesions or oral ulcers.   Past Medical History:  Diagnosis Date  . Trichomonas infection     There are no active problems to display for this patient.   Past Surgical History:  Procedure Laterality Date  . NO PAST SURGERIES      Prior to Admission medications   Medication Sig Start Date End Date Taking? Authorizing Provider  cyclobenzaprine (FLEXERIL) 10 MG tablet Take 1 tablet (10 mg total) by mouth 3 (three) times daily as needed for muscle spasms. Patient not taking: Reported on 08/26/2016 03/03/16   Jami L Hagler, PA-C  ibuprofen (ADVIL,MOTRIN) 600 MG tablet Take 1 tablet (600 mg total) by mouth every 6 (six) hours as needed. Patient not taking: Reported on 08/26/2016 03/03/16   Jami L Hagler, PA-C  megestrol (MEGACE) 40 MG tablet Take 1 tablet (40 mg total) by mouth daily. Patient not taking: Reported on 08/26/2016 09/29/15   Ace Gins Sam, PA-C  metroNIDAZOLE (FLAGYL) 500 MG tablet Take 1 tablet (500 mg total) by mouth 2 (two) times daily. 09/15/16 09/22/16  Aleecia Tapia V Bacon Amelie Caracci, PA-C  norgestimate-ethinyl estradiol (SPRINTEC 28) 0.25-35 MG-MCG tablet Take 1 tablet by mouth daily. Patient not taking: Reported on 06/07/2015 05/05/15   Judeth Horn, NP    Allergies Orange fruit [citrus]  Family History  Problem Relation Age of Onset  .  Diabetes Mother   . Diabetes Brother   . Diabetes Maternal Grandmother     Social History Social History  Substance Use Topics  . Smoking status: Never Smoker  . Smokeless tobacco: Never Used  . Alcohol use Yes     Comment: twice a week    Review of Systems  Constitutional: Negative for fever. Cardiovascular: Negative for chest pain. Respiratory: Negative for shortness of breath. Gastrointestinal: Negative for abdominal pain, vomiting and diarrhea. Genitourinary: Negative for dysuria. Vaginal discharge Skin: Negative for rash. Neurological: Negative for headaches, focal weakness or numbness. ____________________________________________  PHYSICAL EXAM:  VITAL SIGNS: ED Triage Vitals  Enc Vitals Group     BP 09/15/16 1544 119/80     Pulse Rate 09/15/16 1740 67     Resp 09/15/16 1544 18     Temp 09/15/16 1544 98.1 F (36.7 C)     Temp Source 09/15/16 1544 Oral     SpO2 09/15/16 1544 100 %     Weight --      Height --      Head Circumference --      Peak Flow --      Pain Score --      Pain Loc --      Pain Edu? --      Excl. in GC? --     Constitutional: Alert and oriented. Well appearing and in no distress. Head: Normocephalic and atraumatic. Hematological/Lymphatic/Immunological: No inguinal lymphadenopathy. Cardiovascular: Normal rate, regular rhythm. Normal distal pulses. Respiratory: Normal respiratory effort. No wheezes/rales/rhonchi. Gastrointestinal: Soft  and nontender. No distention. GU: Normal external genitalia, except for a shallow, non-tender ulceration to the base of the right labia majora. Scant, white, thin, discharge noted in the vaginal canal. No CMT or adnexal masses.  Skin:  Skin is warm, dry and intact. No rash noted. Psychiatric: Mood and affect are normal. Patient exhibits appropriate insight and judgment. ____________________________________________   LABS (pertinent positives/negatives) Labs Reviewed  WET PREP, GENITAL - Abnormal;  Notable for the following:       Result Value   Clue Cells Wet Prep HPF POC PRESENT (*)    All other components within normal limits  URINALYSIS, COMPLETE (UACMP) WITH MICROSCOPIC - Abnormal; Notable for the following:    Color, Urine YELLOW (*)    APPearance CLOUDY (*)    Protein, ur 30 (*)    Leukocytes, UA SMALL (*)    Bacteria, UA RARE (*)    Squamous Epithelial / LPF 6-30 (*)    All other components within normal limits  CHLAMYDIA/NGC RT PCR ([facility] ONLY)  URINE CULTURE  RPR  POC URINE PREG, ED  POCT PREGNANCY, URINE  ____________________________________________  PROCEDURES  Bicillin 2.4 million units IM ____________________________________________  INITIAL IMPRESSION / ASSESSMENT AND PLAN / ED COURSE  Female patient with an exposure to syphilis. She is treated empirically following her lab draw for RPR. 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 She is also treated for BV. She is encouraged to follow-up with [facility] for repeat testing in 6 & 12 months. Her GC culture is pending at the time of discharge.  ____________________________________________  FINAL CLINICAL IMPRESSION(S) / ED DIAGNOSES  Final diagnoses:  Primary syphilis  BV (bacterial vaginosis)      Lissa HoardJenise V Bacon Austin Pongratz, PA-C 09/15/16 1840    Willy EddyPatrick Robinson, MD 09/15/16 2253

## 2016-09-17 LAB — RPR, QUANT+TP ABS (REFLEX)
Rapid Plasma Reagin, Quant: 1:8 {titer} — ABNORMAL HIGH
T Pallidum Abs: POSITIVE — AB

## 2016-09-17 LAB — RPR: RPR Ser Ql: REACTIVE — AB

## 2016-09-18 ENCOUNTER — Telehealth: Payer: Self-pay | Admitting: Emergency Medicine

## 2016-09-18 LAB — URINE CULTURE: SPECIAL REQUESTS: NORMAL

## 2016-09-18 NOTE — Telephone Encounter (Signed)
Called patient to give her results of syphilis testing done in the ED--she was treated here.  I gave her the number for [facility] std clinic so she can do any follow up testing there.

## 2016-09-19 ENCOUNTER — Telehealth: Payer: Self-pay | Admitting: Emergency Medicine

## 2016-09-19 NOTE — Progress Notes (Signed)
ED Antimicrobial Stewardship Positive Culture Follow Up   Crystal Short is an 26 y.o. female who presented to [facility] on 09/15/2016 with a chief complaint of  Chief Complaint  Patient presents with  . SEXUALLY TRANSMITTED DISEASE    Recent Results (from the past 720 hour(s))  Wet prep, genital     Status: Abnormal   Collection Time: 08/26/16  4:07 PM  Result Value Ref Range Status   Yeast Wet Prep HPF POC NONE SEEN NONE SEEN Final   Trich, Wet Prep NONE SEEN NONE SEEN Final   Clue Cells Wet Prep HPF POC NONE SEEN NONE SEEN Final   WBC, Wet Prep HPF POC FEW (A) NONE SEEN Final   Sperm NONE SEEN  Final  Chlamydia/NGC rt PCR ([facility] only)     Status: None   Collection Time: 08/26/16  4:07 PM  Result Value Ref Range Status   Specimen source GC/Chlam ENDOCERVICAL  Final   Chlamydia Tr NOT DETECTED NOT DETECTED Final   N gonorrhoeae NOT DETECTED NOT DETECTED Final    Comment: (NOTE) 100  This methodology has not been evaluated in pregnant women or in 200  patients with a history of hysterectomy. 300 400  This methodology will not be performed on patients less than 17  years of age.   Urine culture     Status: Abnormal   Collection Time: 09/15/16  3:31 PM  Result Value Ref Range Status   Specimen Description URINE, CATHETERIZED  Final   Special Requests Normal  Final   Culture >=100,000 COLONIES/mL ESCHERICHIA COLI (A)  Final   Report Status 09/18/2016 FINAL  Final   Organism ID, Bacteria ESCHERICHIA COLI (A)  Final      Susceptibility   Escherichia coli - MIC*    AMPICILLIN >=32 RESISTANT Resistant     CEFAZOLIN 32 INTERMEDIATE Intermediate     CEFTRIAXONE <=1 SENSITIVE Sensitive     CIPROFLOXACIN <=0.25 SENSITIVE Sensitive     GENTAMICIN <=1 SENSITIVE Sensitive     IMIPENEM <=0.25 SENSITIVE Sensitive     NITROFURANTOIN <=16 SENSITIVE Sensitive     TRIMETH/SULFA <=20 SENSITIVE Sensitive     AMPICILLIN/SULBACTAM >=32 RESISTANT Resistant     PIP/TAZO <=4 SENSITIVE  Sensitive     Extended ESBL NEGATIVE Sensitive     * >=100,000 COLONIES/mL ESCHERICHIA COLI  Chlamydia/NGC rt PCR     Status: None   Collection Time: 09/15/16  4:45 PM  Result Value Ref Range Status   Specimen source GC/Chlam ENDOCERVICAL  Final   Chlamydia Tr NOT DETECTED NOT DETECTED Final   N gonorrhoeae NOT DETECTED NOT DETECTED Final    Comment: (NOTE) 100  This methodology has not been evaluated in pregnant women or in 200  patients with a history of hysterectomy. 300 400  This methodology will not be performed on patients less than 3  years of age.   Wet prep, genital     Status: Abnormal   Collection Time: 09/15/16  4:45 PM  Result Value Ref Range Status   Yeast Wet Prep HPF POC NONE SEEN NONE SEEN Final   Trich, Wet Prep NONE SEEN NONE SEEN Final   Clue Cells Wet Prep HPF POC PRESENT (A) NONE SEEN Final   WBC, Wet Prep HPF POC NONE SEEN NONE SEEN Final   Sperm NONE SEEN  Final   Patient presented to the ED with syphillus exposure and tested positive for syphillus. Patient received penicillin G 2.4 million units x1 in the ED. Patient  also has bacterial vaginosis and was prescribed metronidazole 500mg  BID x7 day.   Urine culture was positive for E. Coli with ampicillin resistance. Will prescribe Microbid 100mg  BID x 5days.    ED Provider: Dr. Mayford KnifeWilliams  Prescription called [facility] [address].   Crystal Short, PharmD 09/19/2016, 8:57 AM

## 2016-11-10 ENCOUNTER — Encounter: Payer: Self-pay | Admitting: Emergency Medicine

## 2016-11-10 ENCOUNTER — Emergency Department
Admission: EM | Admit: 2016-11-10 | Discharge: 2016-11-10 | Disposition: A | Discharge status: Home / Self Care | Payer: Self-pay | Attending: Emergency Medicine | Admitting: Emergency Medicine

## 2016-11-10 DIAGNOSIS — Z113 Encounter for screening for infections with a predominantly sexual mode of transmission: Secondary | ICD-10-CM | POA: Insufficient documentation

## 2016-11-10 DIAGNOSIS — Z202 Contact with and (suspected) exposure to infections with a predominantly sexual mode of transmission: Secondary | ICD-10-CM

## 2016-11-10 LAB — WET PREP, GENITAL
Clue Cells Wet Prep HPF POC: NONE SEEN
Sperm: NONE SEEN
Trich, Wet Prep: NONE SEEN
YEAST WET PREP: NONE SEEN

## 2016-11-10 LAB — CHLAMYDIA/NGC RT PCR (ARMC ONLY)
CHLAMYDIA TR: NOT DETECTED
N GONORRHOEAE: NOT DETECTED

## 2016-11-10 LAB — POCT PREGNANCY, URINE: PREG TEST UR: NEGATIVE

## 2016-11-10 MED ORDER — PENICILLIN G BENZATHINE 1200000 UNIT/2ML IM SUSP
INTRAMUSCULAR | Status: AC
  Filled 2016-11-10: qty 2

## 2016-11-10 MED ORDER — PENICILLIN G BENZATHINE 1200000 UNIT/2ML IM SUSP
2.4000 10*6.[IU] | Freq: Once | INTRAMUSCULAR | Status: AC
  Administered 2016-11-10: 2.4 10*6.[IU] via INTRAMUSCULAR
  Filled 2016-11-10: qty 4

## 2016-11-10 NOTE — Discharge Instructions (Signed)
Please schedule an appointment at the STD clinic at the health department as soon as possible.

## 2016-11-10 NOTE — ED Triage Notes (Signed)
Patient presents to ED via POV from home due to STD exposure. Patient states, "My boyfriend told me he has syphilis and I want treated". Patient reports vaginal discharge. LMP was in April. Patient states this is abnormal for her but has not taken any at home pregnancy test. A&O x4. Afebrile.

## 2016-11-10 NOTE — ED Notes (Signed)
Spoke with Lurena JoinerRebecca from lab and notified her of add on chlamydia to urine sample. States she will run it. Provider aware.

## 2016-11-10 NOTE — ED Provider Notes (Signed)
[facility] Emergency Department Provider Note  ____________________________________________  Time seen: Approximately 10:44 AM  I have reviewed the triage vital signs and the nursing notes.   HISTORY  Chief Complaint STD exposure   HPI Crystal Short is a 26 y.o. female who presents to the emergency department for STD check. She states that she was having intercourse with 2 separate males. One of the then stated to her that he tested positive for syphilis. She states that she reported for testing and was treated. She states that she advised the second partner who went to be tested as well. He she states that he tested positive but refused the treatment, but lied to her and told her that he had been treated. During a verbal fight yesterday, he reportedly told her that he was positive for syphilis but did not take the treatment because he wanted to "give it back to her."She states that she has had some vaginal discharge but denies fever, dyspareunia, dysuria, or other symptoms of concern. She also denies any rash or lesions.  Past Medical History:  Diagnosis Date  . Trichomonas infection     There are no active problems to display for this patient.   Past Surgical History:  Procedure Laterality Date  . NO PAST SURGERIES      Prior to Admission medications   Medication Sig Start Date End Date Taking? Authorizing Provider  cyclobenzaprine (FLEXERIL) 10 MG tablet Take 1 tablet (10 mg total) by mouth 3 (three) times daily as needed for muscle spasms. Patient not taking: Reported on 08/26/2016 03/03/16   Hagler, Jami L, PA-C  ibuprofen (ADVIL,MOTRIN) 600 MG tablet Take 1 tablet (600 mg total) by mouth every 6 (six) hours as needed. Patient not taking: Reported on 08/26/2016 03/03/16   Hagler, Jami L, PA-C  megestrol (MEGACE) 40 MG tablet Take 1 tablet (40 mg total) by mouth daily. Patient not taking: Reported on 08/26/2016 09/29/15   Carlene Coria, PA-C   norgestimate-ethinyl estradiol (SPRINTEC 28) 0.25-35 MG-MCG tablet Take 1 tablet by mouth daily. Patient not taking: Reported on 06/07/2015 05/05/15   Judeth Horn, NP    Allergies Orange fruit [citrus]  Family History  Problem Relation Age of Onset  . Diabetes Mother   . Diabetes Brother   . Diabetes Maternal Grandmother     Social History Social History  Substance Use Topics  . Smoking status: Never Smoker  . Smokeless tobacco: Never Used  . Alcohol use Yes     Comment: twice a week    Review of Systems Constitutional: Negative for fever. Respiratory: Negative for shortness of breath or cough. Gastrointestinal: Negative for abdominal pain; negative for nausea , negative for vomiting. Genitourinary: Negative for dysuria , positive for vaginal discharge. Musculoskeletal: Negative for back pain. Skin: Negative for rash or lesions.. ____________________________________________   PHYSICAL EXAM:  VITAL SIGNS: ED Triage Vitals  Enc Vitals Group     BP 104/78.      Pulse 59      Resp 18      Temp 98.6      Temp src      SpO2 99%      Weight      Height      Head Circumference      Peak Flow      Pain Score      Pain Loc      Pain Edu?      Excl. in GC?     Constitutional: Alert  and oriented. Well appearing and in no acute distress. Eyes: Conjunctivae are normal. PERRL. EOMI. Head: Atraumatic. Nose: No congestion/rhinnorhea. Mouth/Throat: Mucous membranes are moist. Respiratory: Normal respiratory effort.  No retractions. Gastrointestinal: Soft, nontender, no guarding or rebound on exam. Bowel sounds hasn't no active 4. Genitourinary: Pelvic exam: Clear discharge noted in the vaginal vault. Cervix is closed without any active bleeding noted. Clear drainage noted at the cervical os. No cervical motion tenderness on exam. No adnexal tenderness on bimanual exam. No lesions noted on the labia Musculoskeletal: No extremity tenderness nor edema.  Neurologic:   Normal speech and language. No gross focal neurologic deficits are appreciated. Speech is normal. No gait instability. Skin:  Skin is warm, dry and intact. No rash noted, specifically on the palms or in the vaginal area. Psychiatric: Mood and affect are normal. Speech and behavior are normal.  ____________________________________________   LABS (all labs ordered are listed, but only abnormal results are displayed)  Labs Reviewed  WET PREP, GENITAL - Abnormal; Notable for the following:       Result Value   WBC, Wet Prep HPF POC MANY (*)    All other components within normal limits  CHLAMYDIA/NGC RT PCR ([facility] ONLY)  RPR  POC URINE PREG, ED  POCT PREGNANCY, URINE   ____________________________________________  RADIOLOGY  Not indicated ____________________________________________   PROCEDURES  Procedure(s) performed: None  ____________________________________________  26 year old female presenting to the emergency department for evaluation and treatment of possible syphilis after significant exposure. While in the department, pelvic exam was completed with a negative wet prep except for some white blood cells and Chlamydia and gonorrhea screening was sent to the lab and remains pending. RPR also pending and is a send out. Due to significant exposure, she will be given penicillin G injection. She will be notified if Chlamydia and/or gonorrhea treatment is indicated. She was strongly encouraged to practice safe sex and to follow-up with the health department for full STD screening including HIV testing. She was also encouraged to return to the emergency department for any symptom of concern if she is unable to schedule an appointment.  INITIAL IMPRESSION / ASSESSMENT AND PLAN / ED COURSE  Pertinent labs & imaging results that were available during my care of the patient were reviewed by me and considered in my medical decision making (see chart for  details).  ____________________________________________   FINAL CLINICAL IMPRESSION(S) / ED DIAGNOSES  Final diagnoses:  STD exposure  Exposure to syphilis    Note:  This document was prepared using Dragon voice recognition software and may include unintentional dictation errors.    Chinita Pesterriplett, Cheynne Virden B, FNP 11/10/16 1749    Nita SickleVeronese, [place], MD 11/15/16 1106

## 2016-11-10 NOTE — ED Notes (Signed)
Pt verbalized understanding of discharge instructions. NAD at this time. 

## 2016-11-14 ENCOUNTER — Telehealth: Payer: Self-pay | Admitting: Emergency Medicine

## 2016-11-14 LAB — RPR: RPR: REACTIVE — AB

## 2016-11-14 LAB — RPR, QUANT+TP ABS (REFLEX): TREPONEMA PALLIDUM AB: POSITIVE — AB

## 2017-01-02 ENCOUNTER — Emergency Department
Admission: EM | Admit: 2017-01-02 | Discharge: 2017-01-02 | Disposition: A | Discharge status: Home / Self Care | Payer: Self-pay | Attending: Emergency Medicine | Admitting: Emergency Medicine

## 2017-01-02 ENCOUNTER — Encounter: Payer: Self-pay | Admitting: Emergency Medicine

## 2017-01-02 DIAGNOSIS — Z202 Contact with and (suspected) exposure to infections with a predominantly sexual mode of transmission: Secondary | ICD-10-CM | POA: Insufficient documentation

## 2017-01-02 MED ORDER — PENICILLIN G BENZATHINE 1200000 UNIT/2ML IM SUSP
2.4000 10*6.[IU] | Freq: Once | INTRAMUSCULAR | Status: AC
  Administered 2017-01-02: 2.4 10*6.[IU] via INTRAMUSCULAR

## 2017-01-02 MED ORDER — PENICILLIN G BENZATHINE & PROC 1200000 UNIT/2ML IM SUSP
INTRAMUSCULAR | Status: AC
  Filled 2017-01-02: qty 2

## 2017-01-02 NOTE — ED Provider Notes (Signed)
[facility] Emergency Department Provider Note   ____________________________________________   First MD Initiated Contact with Patient 01/02/17 0155     (approximate)  I have reviewed the triage vital signs and the nursing notes.   HISTORY  Chief Complaint SEXUALLY TRANSMITTED DISEASE    HPI Crystal Short is a 26 y.o. female who comes into the hospital today with an exposure to syphilis. The patient states that her ex-boyfriend told her that he has syphilis. She reports that he told her on Saturday and then called her again today saying that she needed to come in and get checked. She reports that there were last together at the beginning of June and May. She denies any lesions, vaginal discharge or any lymph nodes in her groin. The patient states that her ex has stage II syphilis. She's had no fevers. The patient is here today for treatment and evaluation. She has no other complaints.    Past Medical History:  Diagnosis Date  . Trichomonas infection     There are no active problems to display for this patient.   Past Surgical History:  Procedure Laterality Date  . NO PAST SURGERIES      Prior to Admission medications   Medication Sig Start Date End Date Taking? Authorizing Provider  cyclobenzaprine (FLEXERIL) 10 MG tablet Take 1 tablet (10 mg total) by mouth 3 (three) times daily as needed for muscle spasms. Patient not taking: Reported on 08/26/2016 03/03/16   Hagler, Jami L, PA-C  ibuprofen (ADVIL,MOTRIN) 600 MG tablet Take 1 tablet (600 mg total) by mouth every 6 (six) hours as needed. Patient not taking: Reported on 08/26/2016 03/03/16   Hagler, Jami L, PA-C  megestrol (MEGACE) 40 MG tablet Take 1 tablet (40 mg total) by mouth daily. Patient not taking: Reported on 08/26/2016 09/29/15   Carlene Coria, PA-C  norgestimate-ethinyl estradiol (SPRINTEC 28) 0.25-35 MG-MCG tablet Take 1 tablet by mouth daily. Patient not taking: Reported on  06/07/2015 05/05/15   Judeth Horn, NP    Allergies Orange fruit [citrus]  Family History  Problem Relation Age of Onset  . Diabetes Mother   . Diabetes Brother   . Diabetes Maternal Grandmother     Social History Social History  Substance Use Topics  . Smoking status: Never Smoker  . Smokeless tobacco: Never Used  . Alcohol use Yes     Comment: twice a week    Review of Systems  Constitutional: No fever/chills Eyes: No visual changes. ENT: No sore throat. Cardiovascular: Denies chest pain. Respiratory: Denies shortness of breath. Gastrointestinal: No abdominal pain.  No nausea, no vomiting.  No diarrhea.  No constipation. Genitourinary: Negative for dysuria. Musculoskeletal: Negative for back pain. Skin: Negative for rash. Neurological: Negative for headaches, focal weakness or numbness.   ____________________________________________   PHYSICAL EXAM:  VITAL SIGNS: ED Triage Vitals [01/02/17 0117]  Enc Vitals Group     BP 108/71     Pulse Rate (!) 57     Resp      Temp 98.2 F (36.8 C)     Temp Source Oral     SpO2 100 %     Weight 130 lb (59 kg)     Height 5\' 5"  (1.651 m)     Head Circumference      Peak Flow      Pain Score      Pain Loc      Pain Edu?      Excl. in GC?  Constitutional: Alert and oriented. Well appearing and in no acute distress. Eyes: Conjunctivae are normal. PERRL. EOMI. Head: Atraumatic. Nose: No congestion/rhinnorhea. Mouth/Throat: Mucous membranes are moist.  Oropharynx non-erythematous. Cardiovascular: Normal rate, regular rhythm. Grossly normal heart sounds.  Good peripheral circulation. Respiratory: Normal respiratory effort.  No retractions. Lungs CTAB. Gastrointestinal: Soft and nontender. No distention. Positive bowel sounds Musculoskeletal: No lower extremity tenderness nor edema.   Neurologic:  Normal speech and language.  Skin:  Skin is warm, dry and intact.  Psychiatric: Mood and affect are  normal.  ____________________________________________   LABS (all labs ordered are listed, but only abnormal results are displayed)  Labs Reviewed  RPR   ____________________________________________  EKG  none ____________________________________________  RADIOLOGY  No results found.  ____________________________________________   PROCEDURES  Procedure(s) performed: None  Procedures  Critical Care performed: No  ____________________________________________   INITIAL IMPRESSION / ASSESSMENT AND PLAN / ED COURSE  Pertinent labs & imaging results that were available during my care of the patient were reviewed by me and considered in my medical decision making (see chart for details).  This is a 26 year old female who comes in today with exposure to syphilis. The patient is having no complaints. I will send an RPR on the patient and give her a shot of penicillin G 2.4 million units. I will then discharge the patient home to follow-up with the health department.      ____________________________________________   FINAL CLINICAL IMPRESSION(S) / ED DIAGNOSES  Final diagnoses:  Exposure to syphilis      NEW MEDICATIONS STARTED DURING THIS VISIT:  New Prescriptions   No medications on file     Note:  This document was prepared using Dragon voice recognition software and may include unintentional dictation errors.    Rebecka Apley, MD 01/02/17 [phone]

## 2017-01-02 NOTE — ED Notes (Signed)
Pt made aware of safe protocol for 20 minute med hold to insure no allergic reaction post IM injection.

## 2017-01-02 NOTE — ED Triage Notes (Signed)
Patient ambulatory to triage with steady gait, without difficulty or distress noted; pt st she was notified that her ex-boyfriend has syphllis and would like treatment; pt denies any c/o

## 2017-01-02 NOTE — ED Notes (Signed)
Pt seen walking out of room with boyfriend. Pt asked where she was going. Boyfriend states "come on [place], we ain't waiting." Pt asked to go back into room to go over discharge paperwork. Pt refuses to stay 20 mins post injection. Pt made aware of signs of allergic reaction and to come back to ED if a reaction is suspect, pt agrees to be complaint.

## 2017-01-03 LAB — RPR: RPR: REACTIVE — AB

## 2017-01-03 LAB — RPR, QUANT+TP ABS (REFLEX): T Pallidum Abs: POSITIVE — AB

## 2017-01-11 ENCOUNTER — Emergency Department (HOSPITAL_BASED_OUTPATIENT_CLINIC_OR_DEPARTMENT_OTHER)
Admission: EM | Admit: 2017-01-11 | Discharge: 2017-01-11 | Disposition: A | Discharge status: Home / Self Care | Payer: Self-pay | Attending: Emergency Medicine | Admitting: Emergency Medicine

## 2017-01-11 ENCOUNTER — Emergency Department (HOSPITAL_BASED_OUTPATIENT_CLINIC_OR_DEPARTMENT_OTHER): Payer: Self-pay

## 2017-01-11 ENCOUNTER — Encounter (HOSPITAL_BASED_OUTPATIENT_CLINIC_OR_DEPARTMENT_OTHER): Payer: Self-pay

## 2017-01-11 DIAGNOSIS — R112 Nausea with vomiting, unspecified: Secondary | ICD-10-CM

## 2017-01-11 DIAGNOSIS — R197 Diarrhea, unspecified: Secondary | ICD-10-CM | POA: Insufficient documentation

## 2017-01-11 DIAGNOSIS — R102 Pelvic and perineal pain: Secondary | ICD-10-CM

## 2017-01-11 DIAGNOSIS — R1084 Generalized abdominal pain: Secondary | ICD-10-CM

## 2017-01-11 HISTORY — DX: Syphilis, unspecified: A53.9

## 2017-01-11 LAB — COMPREHENSIVE METABOLIC PANEL
ALBUMIN: 4.7 g/dL (ref 3.5–5.0)
ALK PHOS: 65 U/L (ref 38–126)
ALT: 13 U/L — AB (ref 14–54)
ANION GAP: 10 (ref 5–15)
AST: 22 U/L (ref 15–41)
BILIRUBIN TOTAL: 0.4 mg/dL (ref 0.3–1.2)
BUN: 19 mg/dL (ref 6–20)
CALCIUM: 10 mg/dL (ref 8.9–10.3)
CO2: 25 mmol/L (ref 22–32)
CREATININE: 0.75 mg/dL (ref 0.44–1.00)
Chloride: 104 mmol/L (ref 101–111)
GFR calc Af Amer: >60 mL/min (ref >60)
GFR calc non Af Amer: >60 mL/min (ref >60)
GLUCOSE: 78 mg/dL (ref 65–99)
Potassium: 3.7 mmol/L (ref 3.5–5.1)
SODIUM: 139 mmol/L (ref 135–145)
TOTAL PROTEIN: 8 g/dL (ref 6.5–8.1)

## 2017-01-11 LAB — CBC
HCT: 38.7 % (ref 36.0–46.0)
HEMOGLOBIN: 13.2 g/dL (ref 12.0–15.0)
MCH: 26.7 pg (ref 26.0–34.0)
MCHC: 34.1 g/dL (ref 30.0–36.0)
MCV: 78.2 fL (ref 78.0–100.0)
PLATELETS: 362 10*3/uL (ref 150–400)
RBC: 4.95 MIL/uL (ref 3.87–5.11)
RDW: 15 % (ref 11.5–15.5)
WBC: 8.3 10*3/uL (ref 4.0–10.5)

## 2017-01-11 LAB — WET PREP, GENITAL
Clue Cells Wet Prep HPF POC: NONE SEEN
Sperm: NONE SEEN
Trich, Wet Prep: NONE SEEN
WBC WET PREP: NONE SEEN
Yeast Wet Prep HPF POC: NONE SEEN

## 2017-01-11 LAB — URINALYSIS, MICROSCOPIC (REFLEX)

## 2017-01-11 LAB — LIPASE, BLOOD: Lipase: 33 U/L (ref 11–51)

## 2017-01-11 LAB — URINALYSIS, ROUTINE W REFLEX MICROSCOPIC
BILIRUBIN URINE: NEGATIVE
GLUCOSE, UA: NEGATIVE mg/dL
HGB URINE DIPSTICK: NEGATIVE
Ketones, ur: >80 mg/dL — AB
Nitrite: NEGATIVE
Protein, ur: NEGATIVE mg/dL
Specific Gravity, Urine: 1.02 (ref 1.005–1.030)
pH: 6 (ref 5.0–8.0)

## 2017-01-11 LAB — PREGNANCY, URINE: Preg Test, Ur: NEGATIVE

## 2017-01-11 MED ORDER — CEFTRIAXONE SODIUM 250 MG IJ SOLR
250.0000 mg | Freq: Once | INTRAMUSCULAR | Status: AC
  Administered 2017-01-11: 250 mg via INTRAMUSCULAR
  Filled 2017-01-11: qty 250

## 2017-01-11 MED ORDER — METRONIDAZOLE 500 MG PO TABS: 500.0000 mg | ORAL_TABLET | Freq: Two times a day (BID) | ORAL | 0 refills | Status: AC

## 2017-01-11 MED ORDER — DOXYCYCLINE HYCLATE 100 MG PO CAPS: 100.0000 mg | ORAL_CAPSULE | Freq: Two times a day (BID) | ORAL | 0 refills | Status: AC

## 2017-01-11 MED ORDER — DOXYCYCLINE HYCLATE 100 MG PO TABS
100.0000 mg | ORAL_TABLET | Freq: Once | ORAL | Status: AC
  Administered 2017-01-11: 100 mg via ORAL
  Filled 2017-01-11: qty 1

## 2017-01-11 MED ORDER — SODIUM CHLORIDE 0.9 % IV BOLUS (SEPSIS)
1000.0000 mL | Freq: Once | INTRAVENOUS | Status: AC
  Administered 2017-01-11: 1000 mL via INTRAVENOUS

## 2017-01-11 MED ORDER — MORPHINE SULFATE (PF) 4 MG/ML IV SOLN
4.0000 mg | Freq: Once | INTRAVENOUS | Status: AC
  Administered 2017-01-11: 4 mg via INTRAVENOUS
  Filled 2017-01-11: qty 1

## 2017-01-11 MED ORDER — HYDROCODONE-ACETAMINOPHEN 5-325 MG PO TABS: 1.0000 | ORAL_TABLET | ORAL | 0 refills | Status: DC | PRN

## 2017-01-11 MED ORDER — ONDANSETRON HCL 4 MG/2ML IJ SOLN
4.0000 mg | Freq: Once | INTRAMUSCULAR | Status: AC
  Administered 2017-01-11: 4 mg via INTRAVENOUS
  Filled 2017-01-11: qty 2

## 2017-01-11 MED ORDER — ONDANSETRON HCL 4 MG PO TABS: 4.0000 mg | ORAL_TABLET | Freq: Three times a day (TID) | ORAL | 0 refills | Status: DC | PRN

## 2017-01-11 NOTE — ED Notes (Signed)
ED Provider at bedside. 

## 2017-01-11 NOTE — ED Triage Notes (Signed)
C/o abd pain, HA, n/v/d x 3 days-NAD-steady gait

## 2017-01-11 NOTE — ED Notes (Signed)
Patient transported to Ultrasound 

## 2017-01-11 NOTE — ED Notes (Signed)
Pt voided while in ED WR prior to triage-states she did not ask staff for [employer]

## 2017-01-11 NOTE — Discharge Instructions (Signed)
Please used your pain and nausea medicine to treat your symptoms. Please stay hydrated. Please use the antibiotics to treat possible pelvic inflammatory disease. Please follow-up with her OB/GYN for further evaluation and assessment of the abnormality on ultrasound concerning for a cyst or endometriosis. If any symptoms change or worsen, please return to the nearest emergency department.

## 2017-01-11 NOTE — ED Provider Notes (Signed)
[facility] Provider Note   CSN: [id number] Arrival date & time: 01/11/17  1135     History   Chief Complaint Chief Complaint  Patient presents with  . Abdominal Pain    HPI Crystal Short is a 26 y.o. female.  The history is provided by the patient.  Abdominal Pain   This is a new problem. The current episode started yesterday. The problem occurs constantly. The problem has not changed since onset.The pain is associated with an unknown factor. The pain is located in the RLQ. The quality of the pain is sharp. The pain is at a severity of 7/10. The pain is moderate. Associated symptoms include diarrhea, nausea and vomiting. Pertinent negatives include fever, melena, constipation, dysuria, frequency, hematuria and headaches. Nothing aggravates the symptoms. Nothing relieves the symptoms.  Emesis   This is a new problem. The current episode started yesterday. The problem occurs 2 to 4 times per day. The problem has not changed since onset.The emesis has an appearance of stomach contents. There has been no fever. Associated symptoms include abdominal pain and diarrhea. Pertinent negatives include no chills, no cough, no fever and no headaches.  Diarrhea   This is a new problem. The current episode started yesterday. The problem occurs 2 to 4 times per day. The problem has not changed since onset.Associated symptoms include abdominal pain and vomiting. Pertinent negatives include no chills, no headaches and no cough.    Past Medical History:  Diagnosis Date  . Syphilis   . Trichomonas infection     There are no active problems to display for this patient.   Past Surgical History:  Procedure Laterality Date  . NO PAST SURGERIES      OB History    Gravida Para Term Preterm AB Living   2       1 0   SAB TAB Ectopic Multiple Live Births   1               Home Medications    Prior to Admission medications   Not on File    Family History Family History    Problem Relation Age of Onset  . Diabetes Mother   . Diabetes Brother   . Diabetes Maternal Grandmother     Social History Social History  Substance Use Topics  . Smoking status: Never Smoker  . Smokeless tobacco: Never Used  . Alcohol use Yes     Comment: twice a week     Allergies   Orange fruit [citrus]   Review of Systems Review of Systems  Constitutional: Negative for appetite change, chills, diaphoresis, fatigue and fever.  HENT: Negative for congestion.   Respiratory: Negative for cough, chest tightness, shortness of breath and wheezing.   Cardiovascular: Negative for chest pain and palpitations.  Gastrointestinal: Positive for abdominal pain, diarrhea, nausea and vomiting. Negative for abdominal distention, constipation, melena and rectal pain.  Genitourinary: Positive for pelvic pain. Negative for decreased urine volume, dysuria, flank pain, frequency, hematuria, urgency, vaginal bleeding, vaginal discharge and vaginal pain.  Musculoskeletal: Negative for back pain, neck pain and neck stiffness.  Skin: Negative for rash and wound.  Neurological: Negative for light-headedness and headaches.  Psychiatric/Behavioral: Negative for agitation.  All other systems reviewed and are negative.    Physical Exam Updated Vital Signs BP 124/85 (BP Location: Left Arm)   Pulse 79   Temp 98.4 F (36.9 C) (Oral)   Resp 18   Ht 5\' 5"  (1.651 m)  Wt 60.3 kg (132 lb 15 oz)   LMP 01/03/2017   SpO2 100%   BMI 22.12 kg/m   Physical Exam  Constitutional: She appears well-developed and well-nourished. No distress.  HENT:  Head: Normocephalic and atraumatic.  Mouth/Throat: Oropharynx is clear and moist. No oropharyngeal exudate.  Eyes: Conjunctivae are normal.  Neck: Neck supple.  Cardiovascular: Normal rate and regular rhythm.   No murmur heard. Pulmonary/Chest: Effort normal and breath sounds normal. No stridor. No respiratory distress. She has no wheezes. She exhibits no  tenderness.  Abdominal: Soft. Normal appearance. There is tenderness in the right lower quadrant. There is no rigidity, no rebound and no CVA tenderness.    Genitourinary: Pelvic exam was performed with patient supine. There is no tenderness on the right labia. There is no tenderness on the left labia. Uterus is not tender. Cervix exhibits no motion tenderness, no discharge and no friability. Right adnexum displays tenderness. Left adnexum displays no tenderness. No tenderness or bleeding in the vagina. No vaginal discharge found.  Musculoskeletal: She exhibits no edema or tenderness.  Neurological: She is alert. No sensory deficit. She exhibits normal muscle tone.  Skin: Skin is warm and dry. Capillary refill takes less than 2 seconds. No rash noted. She is not diaphoretic. No erythema.  Psychiatric: She has a normal mood and affect.  Nursing note and vitals reviewed.    ED Treatments / Results  Labs (all labs ordered are listed, but only abnormal results are displayed) Labs Reviewed  COMPREHENSIVE METABOLIC PANEL - Abnormal; Notable for the following:       Result Value   ALT 13 (*)    All other components within normal limits  URINALYSIS, ROUTINE W REFLEX MICROSCOPIC - Abnormal; Notable for the following:    APPearance HAZY (*)    Ketones, ur >80 (*)    Leukocytes, UA TRACE (*)    All other components within normal limits  URINALYSIS, MICROSCOPIC (REFLEX) - Abnormal; Notable for the following:    Bacteria, UA FEW (*)    Squamous Epithelial / LPF 6-30 (*)    All other components within normal limits  WET PREP, GENITAL  LIPASE, BLOOD  CBC  PREGNANCY, URINE  GC/CHLAMYDIA PROBE AMP ([place]) NOT [facility]    EKG  EKG Interpretation None       Radiology [place] Transvaginal Non-ob  Result Date: 01/11/2017 CLINICAL DATA:  Right-sided pelvic pain ; history of sexually transmitted disease with recent treatment for simple S. onset of last normal menstrual period was January 03, 2017. EXAM: TRANSABDOMINAL AND TRANSVAGINAL ULTRASOUND OF PELVIS DOPPLER ULTRASOUND OF OVARIES TECHNIQUE: Both transabdominal and transvaginal ultrasound examinations of the pelvis were performed. Transabdominal technique was performed for global imaging of the pelvis including uterus, ovaries, adnexal regions, and pelvic cul-de-sac. It was necessary to proceed with endovaginal exam following the transabdominal exam to visualize the uterus, endometrium, ovaries, and adnexal regions. Color and duplex Doppler ultrasound was utilized to evaluate blood flow to the ovaries. COMPARISON:  Pelvic ultrasound of August 26, 2016 FINDINGS: Uterus Measurements: 8.4 x 3.4 x 4.6 cm. No fibroids or other mass visualized. Endometrium Thickness: 6.1 mm.  No focal abnormality visualized. Right ovary Measurements: 3.3 x 1.8 x 3.0 cm. Normal appearance/no adnexal mass. Left ovary Measurements: 3.8 x 1.8 x 3.7 cm. There is a normal appearance of the left ovary with developing follicles. Adjacent to the left ovary there is a slightly hyperechoic fairly homogeneous appearing region with inter increased vascularity. No cystic component  is observed within it. This measures 1.5 x 1.2 x 2.4 cm. There is a tiny left adnexal cystic structure without internal echoes which measures 1.5 x 1.4 x 1.2 cm. Its walls exhibit no hypervascularity. Pulsed Doppler evaluation of both ovaries demonstrates normal low-resistance arterial and venous waveforms. Other findings There is a small amount of free pelvic fluid. IMPRESSION: Normal appearance of the uterus, endometrium, and both ovaries. The right adnexal region is normal. In the left adnexal region there is a simple appearing cystic structure measuring 1.5 cm in greatest dimension that is separate from the ovary. There is also a solid-appearing slightly hyperechoic, hypervascular structure lateral to the ovary which measures 1.5 x 1.2 x 2.4 cm. This is nonspecific in appearance but could reflect a  tubo-ovarian abscess or endometrioma. An ectopic pregnancy is felt less likely but reportedly the beta HCG is pending. Gynecological evaluation is needed. Further imaging with CT scanning or MRI is available upon request. Electronically Signed   By: David  [place] M.D.   On: 01/11/2017 16:53   US Pelvis Complete  Result Date: 01/11/2017 CLINICAL DATA:  Right-sided pelvic pain ; history of sexually transmitted disease with recent treatment for simple S. onset of last normal menstrual period was January 03, 2017. EXAM: TRANSABDOMINAL AND TRANSVAGINAL ULTRASOUND OF PELVIS DOPPLER ULTRASOUND OF OVARIES TECHNIQUE: Both transabdominal and transvaginal ultrasound examinations of the pelvis were performed. Transabdominal technique was performed for global imaging of the pelvis including uterus, ovaries, adnexal regions, and pelvic cul-de-sac. It was necessary to proceed with endovaginal exam following the transabdominal exam to visualize the uterus, endometrium, ovaries, and adnexal regions. Color and duplex Doppler ultrasound was utilized to evaluate blood flow to the ovaries. COMPARISON:  Pelvic ultrasound of August 26, 2016 FINDINGS: Uterus Measurements: 8.4 x 3.4 x 4.6 cm. No fibroids or other mass visualized. Endometrium Thickness: 6.1 mm.  No focal abnormality visualized. Right ovary Measurements: 3.3 x 1.8 x 3.0 cm. Normal appearance/no adnexal mass. Left ovary Measurements: 3.8 x 1.8 x 3.7 cm. There is a normal appearance of the left ovary with developing follicles. Adjacent to the left ovary there is a slightly hyperechoic fairly homogeneous appearing region with inter increased vascularity. No cystic component is observed within it. This measures 1.5 x 1.2 x 2.4 cm. There is a tiny left adnexal cystic structure without internal echoes which measures 1.5 x 1.4 x 1.2 cm. Its walls exhibit no hypervascularity. Pulsed Doppler evaluation of both ovaries demonstrates normal low-resistance arterial and venous waveforms.  Other findings There is a small amount of free pelvic fluid. IMPRESSION: Normal appearance of the uterus, endometrium, and both ovaries. The right adnexal region is normal. In the left adnexal region there is a simple appearing cystic structure measuring 1.5 cm in greatest dimension that is separate from the ovary. There is also a solid-appearing slightly hyperechoic, hypervascular structure lateral to the ovary which measures 1.5 x 1.2 x 2.4 cm. This is nonspecific in appearance but could reflect a tubo-ovarian abscess or endometrioma. An ectopic pregnancy is felt less likely but reportedly the beta HCG is pending. Gynecological evaluation is needed. Further imaging with CT scanning or MRI is available upon request. Electronically Signed   By: David  [place] M.D.   On: 01/11/2017 16:53   [place] Art/ven Flow Abd Pelv Doppler  Result Date: 01/11/2017 CLINICAL DATA:  Right-sided pelvic pain ; history of sexually transmitted disease with recent treatment for simple S. onset of last normal menstrual period was January 03, 2017. EXAM: TRANSABDOMINAL AND TRANSVAGINAL  ULTRASOUND OF PELVIS DOPPLER ULTRASOUND OF OVARIES TECHNIQUE: Both transabdominal and transvaginal ultrasound examinations of the pelvis were performed. Transabdominal technique was performed for global imaging of the pelvis including uterus, ovaries, adnexal regions, and pelvic cul-de-sac. It was necessary to proceed with endovaginal exam following the transabdominal exam to visualize the uterus, endometrium, ovaries, and adnexal regions. Color and duplex Doppler ultrasound was utilized to evaluate blood flow to the ovaries. COMPARISON:  Pelvic ultrasound of August 26, 2016 FINDINGS: Uterus Measurements: 8.4 x 3.4 x 4.6 cm. No fibroids or other mass visualized. Endometrium Thickness: 6.1 mm.  No focal abnormality visualized. Right ovary Measurements: 3.3 x 1.8 x 3.0 cm. Normal appearance/no adnexal mass. Left ovary Measurements: 3.8 x 1.8 x 3.7 cm. There is a  normal appearance of the left ovary with developing follicles. Adjacent to the left ovary there is a slightly hyperechoic fairly homogeneous appearing region with inter increased vascularity. No cystic component is observed within it. This measures 1.5 x 1.2 x 2.4 cm. There is a tiny left adnexal cystic structure without internal echoes which measures 1.5 x 1.4 x 1.2 cm. Its walls exhibit no hypervascularity. Pulsed Doppler evaluation of both ovaries demonstrates normal low-resistance arterial and venous waveforms. Other findings There is a small amount of free pelvic fluid. IMPRESSION: Normal appearance of the uterus, endometrium, and both ovaries. The right adnexal region is normal. In the left adnexal region there is a simple appearing cystic structure measuring 1.5 cm in greatest dimension that is separate from the ovary. There is also a solid-appearing slightly hyperechoic, hypervascular structure lateral to the ovary which measures 1.5 x 1.2 x 2.4 cm. This is nonspecific in appearance but could reflect a tubo-ovarian abscess or endometrioma. An ectopic pregnancy is felt less likely but reportedly the beta HCG is pending. Gynecological evaluation is needed. Further imaging with CT scanning or MRI is available upon request. Electronically Signed   By: David  [place] M.D.   On: 01/11/2017 16:53    Procedures Procedures (including critical care time)  Medications Ordered in ED Medications  sodium chloride 0.9 % bolus 1,000 mL (0 mLs Intravenous Stopped 01/11/17 1518)  ondansetron (ZOFRAN) injection 4 mg (4 mg Intravenous Given 01/11/17 1311)  morphine 4 MG/ML injection 4 mg (4 mg Intravenous Given 01/11/17 1312)  morphine 4 MG/ML injection 4 mg (4 mg Intravenous Given 01/11/17 1525)  doxycycline (VIBRA-TABS) tablet 100 mg (100 mg Oral Given 01/11/17 1742)  cefTRIAXone (ROCEPHIN) injection 250 mg (250 mg Intramuscular Given 01/11/17 1743)     Initial Impression / Assessment and Plan / ED Course  I have  reviewed the triage vital signs and the nursing notes.  Pertinent labs & imaging results that were available during my care of the patient were reviewed by me and considered in my medical decision making (see chart for details).     Crystal Short is a 26 y.o. female With a past medical history significant for all sexually-transmitted infections who presents with right-sided pelvic pain, abdominal pain, nausea, vomiting, and diarrhea. Patient reports that her symptoms began yesterday and have been persistent. She reports nausea with nonbloody vomiting and watery diarrhea. She reports the episodes correspond with worsened pain. She denies trauma. She reports that she has had no vaginal discharge or vaginal bleeding. She reports that she has been treated for previous STI's. She reports her last menstrual cycle was several days ago. She denies fevers, chills, or other complaints on arrival.  On exam, patient has very low right lower quadrant tenderness.  No CVA tenderness. Pelvic exam had right adnexal tenderness but minimal CMT. No tenderness on the left side.  Lungs clear and exam otherwise unremarkable.  Patient had laboratory testing with results seen above. Wet prep negative. CMP reassuring. Lipase negative. No leukocytosis or anemia. An absence of urinary symptoms, urinalysis not convincing for UTI. Culture sent. Pregnancy test negative.  Patient had pelvic ultrasound given the adnexal tenderness and STI history. Ultrasound showed normal right adnexal. Left-sided had an irregular finding of cyst vs TOA versus endometrial area vs ectopic pregnancy. Pregnancy test negative, doubt ectopic. In the absence of tenderness on the left side, doubt TOA.  Patient re-examined and had no right lower quadrant abdominal tenderness. With reassuring vitals and labs, doubt appendicitis.  In this setting of the vomiting and diarrhea, suspect viral gastroenteritis causing her symptoms however, with STI history and  the tenderness, patient will be treated empirically for PID.  Patient given prescription for pain and nausea medicine and will follow up with OB/GYN. Return precautions were given and understood. Patient discharged in good condition.   Final Clinical Impressions(s) / ED Diagnoses   Final diagnoses:  Pelvic pain  Nausea vomiting and diarrhea  Generalized abdominal pain    New Prescriptions Discharge Medication List as of 01/11/2017  5:42 PM    START taking these medications   Details  doxycycline (VIBRAMYCIN) 100 MG capsule Take 1 capsule (100 mg total) by mouth 2 (two) times daily., Starting Thu 01/11/2017, Until Thu 01/25/2017, Print    HYDROcodone-acetaminophen (NORCO/VICODIN) 5-325 MG tablet Take 1 tablet by mouth every 4 (four) hours as needed., Starting Thu 01/11/2017, Print    metroNIDAZOLE (FLAGYL) 500 MG tablet Take 1 tablet (500 mg total) by mouth 2 (two) times daily., Starting Thu 01/11/2017, Until Thu 01/25/2017, Print    ondansetron (ZOFRAN) 4 MG tablet Take 1 tablet (4 mg total) by mouth every 8 (eight) hours as needed for nausea or vomiting., Starting Thu 01/11/2017, Print        Clinical Impression: 1. Pelvic pain   2. Nausea vomiting and diarrhea   3. Generalized abdominal pain     Disposition: Discharge  Condition: Good  I have discussed the results, Dx and Tx plan with the pt(& family if present). He/she/they expressed understanding and agree(s) with the plan. Discharge instructions discussed at great length. Strict return precautions discussed and pt &/or family have verbalized understanding of the instructions. No further questions at time of discharge.    Discharge Medication List as of 01/11/2017  5:42 PM    START taking these medications   Details  doxycycline (VIBRAMYCIN) 100 MG capsule Take 1 capsule (100 mg total) by mouth 2 (two) times daily., Starting Thu 01/11/2017, Until Thu 01/25/2017, Print    HYDROcodone-acetaminophen (NORCO/VICODIN) 5-325 MG  tablet Take 1 tablet by mouth every 4 (four) hours as needed., Starting Thu 01/11/2017, Print    metroNIDAZOLE (FLAGYL) 500 MG tablet Take 1 tablet (500 mg total) by mouth 2 (two) times daily., Starting Thu 01/11/2017, Until Thu 01/25/2017, Print    ondansetron (ZOFRAN) 4 MG tablet Take 1 tablet (4 mg total) by mouth every 8 (eight) hours as needed for nausea or vomiting., Starting Thu 01/11/2017, Print        Follow Up: [facility] [address] [place] [address] [phone] Schedule an appointment as soon as possible for a visit    [facility] [address] [place] [address] [phone] Schedule an appointment  as soon as possible for a visit    [facility] [place] [facility] [address] [id number] mc [address] [place] [address] [phone]  If symptoms worsen     Shrika Milos, Canary Brim, MD 01/11/17 [phone]

## 2017-01-12 LAB — GC/CHLAMYDIA PROBE AMP (~~LOC~~) NOT AT ARMC
CHLAMYDIA, DNA PROBE: NEGATIVE
NEISSERIA GONORRHEA: NEGATIVE

## 2017-01-13 ENCOUNTER — Encounter (HOSPITAL_BASED_OUTPATIENT_CLINIC_OR_DEPARTMENT_OTHER): Payer: Self-pay | Admitting: Emergency Medicine

## 2017-01-13 ENCOUNTER — Emergency Department (HOSPITAL_BASED_OUTPATIENT_CLINIC_OR_DEPARTMENT_OTHER)
Admission: EM | Admit: 2017-01-13 | Discharge: 2017-01-13 | Disposition: A | Discharge status: Home / Self Care | Payer: Self-pay | Attending: Emergency Medicine | Admitting: Emergency Medicine

## 2017-01-13 DIAGNOSIS — R1084 Generalized abdominal pain: Secondary | ICD-10-CM | POA: Insufficient documentation

## 2017-01-13 DIAGNOSIS — R112 Nausea with vomiting, unspecified: Secondary | ICD-10-CM | POA: Insufficient documentation

## 2017-01-13 DIAGNOSIS — R197 Diarrhea, unspecified: Secondary | ICD-10-CM | POA: Insufficient documentation

## 2017-01-13 LAB — CBC WITH DIFFERENTIAL/PLATELET
BASOS ABS: 0.1 10*3/uL (ref 0.0–0.1)
Basophils Relative: 1 %
EOS ABS: 0.1 10*3/uL (ref 0.0–0.7)
EOS PCT: 1 %
HCT: 36.8 % (ref 36.0–46.0)
HEMOGLOBIN: 12.6 g/dL (ref 12.0–15.0)
LYMPHS PCT: 24 %
Lymphs Abs: 2.3 10*3/uL (ref 0.7–4.0)
MCH: 26.6 pg (ref 26.0–34.0)
MCHC: 34.2 g/dL (ref 30.0–36.0)
MCV: 77.6 fL — ABNORMAL LOW (ref 78.0–100.0)
Monocytes Absolute: 0.5 10*3/uL (ref 0.1–1.0)
Monocytes Relative: 5 %
NEUTROS PCT: 69 %
Neutro Abs: 6.9 10*3/uL (ref 1.7–7.7)
PLATELETS: 370 10*3/uL (ref 150–400)
RBC: 4.74 MIL/uL (ref 3.87–5.11)
RDW: 14.9 % (ref 11.5–15.5)
WBC: 9.8 10*3/uL (ref 4.0–10.5)

## 2017-01-13 LAB — COMPREHENSIVE METABOLIC PANEL
ALBUMIN: 4.4 g/dL (ref 3.5–5.0)
ALK PHOS: 70 U/L (ref 38–126)
ALT: 10 U/L — AB (ref 14–54)
AST: 21 U/L (ref 15–41)
Anion gap: 9 (ref 5–15)
BUN: 14 mg/dL (ref 6–20)
CHLORIDE: 105 mmol/L (ref 101–111)
CO2: 25 mmol/L (ref 22–32)
CREATININE: 0.87 mg/dL (ref 0.44–1.00)
Calcium: 9.8 mg/dL (ref 8.9–10.3)
GFR calc Af Amer: >60 mL/min (ref >60)
GFR calc non Af Amer: >60 mL/min (ref >60)
GLUCOSE: 83 mg/dL (ref 65–99)
Potassium: 4 mmol/L (ref 3.5–5.1)
SODIUM: 139 mmol/L (ref 135–145)
Total Bilirubin: 0.6 mg/dL (ref 0.3–1.2)
Total Protein: 7.6 g/dL (ref 6.5–8.1)

## 2017-01-13 LAB — PREGNANCY, URINE: PREG TEST UR: NEGATIVE

## 2017-01-13 LAB — LIPASE, BLOOD: Lipase: 32 U/L (ref 11–51)

## 2017-01-13 MED ORDER — PROMETHAZINE HCL 25 MG/ML IJ SOLN
25.0000 mg | Freq: Once | INTRAMUSCULAR | Status: AC
  Administered 2017-01-13: 25 mg via INTRAVENOUS
  Filled 2017-01-13: qty 1

## 2017-01-13 MED ORDER — SODIUM CHLORIDE 0.9 % IV BOLUS (SEPSIS)
1000.0000 mL | Freq: Once | INTRAVENOUS | Status: AC
  Administered 2017-01-13: 1000 mL via INTRAVENOUS

## 2017-01-13 MED ORDER — PROMETHAZINE HCL 25 MG PO TABS: 25.0000 mg | ORAL_TABLET | Freq: Four times a day (QID) | ORAL | 0 refills | Status: DC | PRN

## 2017-01-13 NOTE — Discharge Instructions (Signed)
Your symptoms may take 1-2 weeks to fully resolve Take nausea medications as needed Your work-up is reassuring Return without fail for worsening symptoms, including fever, worsening pain, intractable vomiting or any other symptoms concerning to you.

## 2017-01-13 NOTE — ED Provider Notes (Signed)
[facility] Provider Note   CSN: [id number] Arrival date & time: 01/13/17  1037     History   Chief Complaint Chief Complaint  Patient presents with  . Abdominal Cramping    HPI Crystal Short is a 26 y.o. female.  HPI 26 year old female who presents with nausea, vomiting, diarrhea and abdominal cramping. Symptoms started 5 days ago. Was seen in the ED 2 days ago and diagnosed with STD/PID and sent home with doxycycline and Flagyl. A pelvic ultrasound that showed possible cystic structure in the left adnexa but no other acute pelvic processes.  Not taking any of her antibiotics. States that she was unable to afford them and could not afford any nausea medications. Has not been able to tolerate by mouth intake due to nausea and vomiting. Has not had bloody diarrhea or hematemesis. No fevers or chills. No dysuria, urinary frequency, abnormal vaginal bleeding or discharge. No known sick contacts. No recent travel. No recent antibiotics.   Past Medical History:  Diagnosis Date  . Syphilis   . Trichomonas infection     There are no active problems to display for this patient.   Past Surgical History:  Procedure Laterality Date  . NO PAST SURGERIES      OB History    Gravida Para Term Preterm AB Living   2       1 0   SAB TAB Ectopic Multiple Live Births   1               Home Medications    Prior to Admission medications   Medication Sig Start Date End Date Taking? Authorizing Provider  doxycycline (VIBRAMYCIN) 100 MG capsule Take 1 capsule (100 mg total) by mouth 2 (two) times daily. 01/11/17 01/25/17  Tegeler, Canary Brim, MD  HYDROcodone-acetaminophen (NORCO/VICODIN) 5-325 MG tablet Take 1 tablet by mouth every 4 (four) hours as needed. 01/11/17   Tegeler, Canary Brim, MD  metroNIDAZOLE (FLAGYL) 500 MG tablet Take 1 tablet (500 mg total) by mouth 2 (two) times daily. 01/11/17 01/25/17  Tegeler, Canary Brim, MD  ondansetron (ZOFRAN) 4 MG tablet Take 1  tablet (4 mg total) by mouth every 8 (eight) hours as needed for nausea or vomiting. 01/11/17   Tegeler, Canary Brim, MD  promethazine (PHENERGAN) 25 MG tablet Take 1 tablet (25 mg total) by mouth every 6 (six) hours as needed for nausea or vomiting. 01/13/17   Lavera Guise, MD    Family History Family History  Problem Relation Age of Onset  . Diabetes Mother   . Diabetes Brother   . Diabetes Maternal Grandmother     Social History Social History  Substance Use Topics  . Smoking status: Never Smoker  . Smokeless tobacco: Never Used  . Alcohol use Yes     Comment: twice a week     Allergies   Orange fruit [citrus]   Review of Systems Review of Systems  Constitutional: Positive for appetite change. Negative for fever.  Respiratory: Negative for shortness of breath.   Cardiovascular: Negative for chest pain.  Gastrointestinal: Positive for abdominal pain, diarrhea, nausea and vomiting.  Genitourinary: Negative for dysuria and frequency.     Physical Exam Updated Vital Signs BP 116/77 (BP Location: Right Arm)   Pulse (!) 56   Temp 98.6 F (37 C) (Oral)   Resp 18   Ht 5\' 5"  (1.651 m)   Wt 59.9 kg (132 lb)   LMP 01/03/2017   SpO2 100%   BMI  21.97 kg/m   Physical Exam Physical Exam  Nursing note and vitals reviewed. Constitutional: Well developed, well nourished, non-toxic, and in no acute distress Head: Normocephalic and atraumatic.  Mouth/Throat: Oropharynx is clear and dry mucous membranes.  Neck: Normal range of motion. Neck supple.  Cardiovascular: Normal rate and regular rhythm.   Pulmonary/Chest: Effort normal and breath sounds normal.  Abdominal: Soft. There is no tenderness. There is no rebound and no guarding.  Musculoskeletal: Normal range of motion.  Neurological: Alert, no facial droop, fluent speech, moves all extremities symmetrically Skin: Skin is warm and dry.  Psychiatric: Cooperative   ED Treatments / Results  Labs (all labs ordered are  listed, but only abnormal results are displayed) Labs Reviewed  CBC WITH DIFFERENTIAL/PLATELET - Abnormal; Notable for the following:       Result Value   MCV 77.6 (*)    All other components within normal limits  COMPREHENSIVE METABOLIC PANEL - Abnormal; Notable for the following:    ALT 10 (*)    All other components within normal limits  LIPASE, BLOOD  PREGNANCY, URINE    EKG  EKG Interpretation None       Radiology US Transvaginal Non-ob  Result Date: 01/11/2017 CLINICAL DATA:  Right-sided pelvic pain ; history of sexually transmitted disease with recent treatment for simple S. onset of last normal menstrual period was January 03, 2017. EXAM: TRANSABDOMINAL AND TRANSVAGINAL ULTRASOUND OF PELVIS DOPPLER ULTRASOUND OF OVARIES TECHNIQUE: Both transabdominal and transvaginal ultrasound examinations of the pelvis were performed. Transabdominal technique was performed for global imaging of the pelvis including uterus, ovaries, adnexal regions, and pelvic cul-de-sac. It was necessary to proceed with endovaginal exam following the transabdominal exam to visualize the uterus, endometrium, ovaries, and adnexal regions. Color and duplex Doppler ultrasound was utilized to evaluate blood flow to the ovaries. COMPARISON:  Pelvic ultrasound of August 26, 2016 FINDINGS: Uterus Measurements: 8.4 x 3.4 x 4.6 cm. No fibroids or other mass visualized. Endometrium Thickness: 6.1 mm.  No focal abnormality visualized. Right ovary Measurements: 3.3 x 1.8 x 3.0 cm. Normal appearance/no adnexal mass. Left ovary Measurements: 3.8 x 1.8 x 3.7 cm. There is a normal appearance of the left ovary with developing follicles. Adjacent to the left ovary there is a slightly hyperechoic fairly homogeneous appearing region with inter increased vascularity. No cystic component is observed within it. This measures 1.5 x 1.2 x 2.4 cm. There is a tiny left adnexal cystic structure without internal echoes which measures 1.5 x 1.4 x  1.2 cm. Its walls exhibit no hypervascularity. Pulsed Doppler evaluation of both ovaries demonstrates normal low-resistance arterial and venous waveforms. Other findings There is a small amount of free pelvic fluid. IMPRESSION: Normal appearance of the uterus, endometrium, and both ovaries. The right adnexal region is normal. In the left adnexal region there is a simple appearing cystic structure measuring 1.5 cm in greatest dimension that is separate from the ovary. There is also a solid-appearing slightly hyperechoic, hypervascular structure lateral to the ovary which measures 1.5 x 1.2 x 2.4 cm. This is nonspecific in appearance but could reflect a tubo-ovarian abscess or endometrioma. An ectopic pregnancy is felt less likely but reportedly the beta HCG is pending. Gynecological evaluation is needed. Further imaging with CT scanning or MRI is available upon request. Electronically Signed   By: David  [place] M.D.   On: 01/11/2017 16:53   US Pelvis Complete  Result Date: 01/11/2017 CLINICAL DATA:  Right-sided pelvic pain ; history of sexually transmitted disease  with recent treatment for simple S. onset of last normal menstrual period was January 03, 2017. EXAM: TRANSABDOMINAL AND TRANSVAGINAL ULTRASOUND OF PELVIS DOPPLER ULTRASOUND OF OVARIES TECHNIQUE: Both transabdominal and transvaginal ultrasound examinations of the pelvis were performed. Transabdominal technique was performed for global imaging of the pelvis including uterus, ovaries, adnexal regions, and pelvic cul-de-sac. It was necessary to proceed with endovaginal exam following the transabdominal exam to visualize the uterus, endometrium, ovaries, and adnexal regions. Color and duplex Doppler ultrasound was utilized to evaluate blood flow to the ovaries. COMPARISON:  Pelvic ultrasound of August 26, 2016 FINDINGS: Uterus Measurements: 8.4 x 3.4 x 4.6 cm. No fibroids or other mass visualized. Endometrium Thickness: 6.1 mm.  No focal abnormality  visualized. Right ovary Measurements: 3.3 x 1.8 x 3.0 cm. Normal appearance/no adnexal mass. Left ovary Measurements: 3.8 x 1.8 x 3.7 cm. There is a normal appearance of the left ovary with developing follicles. Adjacent to the left ovary there is a slightly hyperechoic fairly homogeneous appearing region with inter increased vascularity. No cystic component is observed within it. This measures 1.5 x 1.2 x 2.4 cm. There is a tiny left adnexal cystic structure without internal echoes which measures 1.5 x 1.4 x 1.2 cm. Its walls exhibit no hypervascularity. Pulsed Doppler evaluation of both ovaries demonstrates normal low-resistance arterial and venous waveforms. Other findings There is a small amount of free pelvic fluid. IMPRESSION: Normal appearance of the uterus, endometrium, and both ovaries. The right adnexal region is normal. In the left adnexal region there is a simple appearing cystic structure measuring 1.5 cm in greatest dimension that is separate from the ovary. There is also a solid-appearing slightly hyperechoic, hypervascular structure lateral to the ovary which measures 1.5 x 1.2 x 2.4 cm. This is nonspecific in appearance but could reflect a tubo-ovarian abscess or endometrioma. An ectopic pregnancy is felt less likely but reportedly the beta HCG is pending. Gynecological evaluation is needed. Further imaging with CT scanning or MRI is available upon request. Electronically Signed   By: David  [place] M.D.   On: 01/11/2017 16:53   [place] Art/ven Flow Abd Pelv Doppler  Result Date: 01/11/2017 CLINICAL DATA:  Right-sided pelvic pain ; history of sexually transmitted disease with recent treatment for simple S. onset of last normal menstrual period was January 03, 2017. EXAM: TRANSABDOMINAL AND TRANSVAGINAL ULTRASOUND OF PELVIS DOPPLER ULTRASOUND OF OVARIES TECHNIQUE: Both transabdominal and transvaginal ultrasound examinations of the pelvis were performed. Transabdominal technique was performed for global  imaging of the pelvis including uterus, ovaries, adnexal regions, and pelvic cul-de-sac. It was necessary to proceed with endovaginal exam following the transabdominal exam to visualize the uterus, endometrium, ovaries, and adnexal regions. Color and duplex Doppler ultrasound was utilized to evaluate blood flow to the ovaries. COMPARISON:  Pelvic ultrasound of August 26, 2016 FINDINGS: Uterus Measurements: 8.4 x 3.4 x 4.6 cm. No fibroids or other mass visualized. Endometrium Thickness: 6.1 mm.  No focal abnormality visualized. Right ovary Measurements: 3.3 x 1.8 x 3.0 cm. Normal appearance/no adnexal mass. Left ovary Measurements: 3.8 x 1.8 x 3.7 cm. There is a normal appearance of the left ovary with developing follicles. Adjacent to the left ovary there is a slightly hyperechoic fairly homogeneous appearing region with inter increased vascularity. No cystic component is observed within it. This measures 1.5 x 1.2 x 2.4 cm. There is a tiny left adnexal cystic structure without internal echoes which measures 1.5 x 1.4 x 1.2 cm. Its walls exhibit no hypervascularity. Pulsed Doppler evaluation  of both ovaries demonstrates normal low-resistance arterial and venous waveforms. Other findings There is a small amount of free pelvic fluid. IMPRESSION: Normal appearance of the uterus, endometrium, and both ovaries. The right adnexal region is normal. In the left adnexal region there is a simple appearing cystic structure measuring 1.5 cm in greatest dimension that is separate from the ovary. There is also a solid-appearing slightly hyperechoic, hypervascular structure lateral to the ovary which measures 1.5 x 1.2 x 2.4 cm. This is nonspecific in appearance but could reflect a tubo-ovarian abscess or endometrioma. An ectopic pregnancy is felt less likely but reportedly the beta HCG is pending. Gynecological evaluation is needed. Further imaging with CT scanning or MRI is available upon request. Electronically Signed   By:  David  [place] M.D.   On: 01/11/2017 16:53    Procedures Procedures (including critical care time)  Medications Ordered in ED Medications  sodium chloride 0.9 % bolus 1,000 mL (0 mLs Intravenous Stopping Infusion hung by another clincian 01/13/17 1352)  promethazine (PHENERGAN) injection 25 mg (25 mg Intravenous Given 01/13/17 1236)     Initial Impression / Assessment and Plan / ED Course  I have reviewed the triage vital signs and the nursing notes.  Pertinent labs & imaging results that were available during my care of the patient were reviewed by me and considered in my medical decision making (see chart for details).     25 year old female who presents with nausea, vomiting, diarrhea with intermittent abdominal cramping. She is afebrile and hemodynamically stable. Mildly dry on exam. Chest is soft and benign and nontender abdomen. Cramping more associated with vomiting and diarrhea and then goes away. Does not seem consistent for serious intra-abdominal process such as appendicitis or colitis.  Blood work is reassuring. Workup from recent ED visit is reviewed. Given lack of abdominal pain during ED stay do not feel that presentation is concerning for acute intra-abdominal or pelvic processes. She is given IV fluids and IV antibiotics and feels improved. Has been able to tolerate by mouth intake without difficulty. Discharge with prescription for Phenergan. STD testing from a few days ago-negative and she requests not to take antibiotics, which is reasonable.   Strict return and follow-up instructions reviewed. She expressed understanding of all discharge instructions and felt comfortable with the plan of care.   Final Clinical Impressions(s) / ED Diagnoses   Final diagnoses:  Nausea vomiting and diarrhea    New Prescriptions New Prescriptions   PROMETHAZINE (PHENERGAN) 25 MG TABLET    Take 1 tablet (25 mg total) by mouth every 6 (six) hours as needed for nausea or vomiting.       Lavera Guise, MD 01/13/17 [phone]

## 2017-01-13 NOTE — ED Triage Notes (Signed)
Patient states that she has had generalized abdominal pain x 1 week. Was here recently and given meds to treat the the infection. The patient reports that she was unable to get them filled because the cost was too high. The patient reports that she continues to have generalized abdominal pain. Vomiting x 2 - patient is tearful in triage

## 2017-01-13 NOTE — ED Notes (Signed)
ED Provider at bedside. 

## 2017-01-13 NOTE — ED Notes (Signed)
Pt reports not being able to eat or drink anything since Monday.

## 2017-01-13 NOTE — ED Notes (Signed)
Pt given PO gingerale. No emesis noted. Pt sleeping in bed with visitor.

## 2017-10-04 ENCOUNTER — Encounter: Payer: Self-pay | Admitting: Emergency Medicine

## 2017-10-04 ENCOUNTER — Emergency Department
Admission: EM | Admit: 2017-10-04 | Discharge: 2017-10-04 | Disposition: A | Discharge status: Home / Self Care | Payer: Self-pay | Attending: Emergency Medicine | Admitting: Emergency Medicine

## 2017-10-04 DIAGNOSIS — Z3202 Encounter for pregnancy test, result negative: Secondary | ICD-10-CM | POA: Insufficient documentation

## 2017-10-04 DIAGNOSIS — A084 Viral intestinal infection, unspecified: Secondary | ICD-10-CM | POA: Insufficient documentation

## 2017-10-04 LAB — COMPREHENSIVE METABOLIC PANEL
ALBUMIN: 4.9 g/dL (ref 3.5–5.0)
ALK PHOS: 74 U/L (ref 38–126)
ALT: 10 U/L — ABNORMAL LOW (ref 14–54)
ANION GAP: 8 (ref 5–15)
AST: 23 U/L (ref 15–41)
BILIRUBIN TOTAL: 0.8 mg/dL (ref 0.3–1.2)
BUN: 13 mg/dL (ref 6–20)
CO2: 24 mmol/L (ref 22–32)
Calcium: 9.9 mg/dL (ref 8.9–10.3)
Chloride: 105 mmol/L (ref 101–111)
Creatinine, Ser: 0.86 mg/dL (ref 0.44–1.00)
GFR calc Af Amer: >60 mL/min (ref >60)
GFR calc non Af Amer: >60 mL/min (ref >60)
GLUCOSE: 101 mg/dL — AB (ref 65–99)
POTASSIUM: 3.7 mmol/L (ref 3.5–5.1)
SODIUM: 137 mmol/L (ref 135–145)
Total Protein: 8.6 g/dL — ABNORMAL HIGH (ref 6.5–8.1)

## 2017-10-04 LAB — CBC WITH DIFFERENTIAL/PLATELET
BASOS ABS: 0 10*3/uL (ref 0–0.1)
Basophils Relative: 0 %
EOS ABS: 0.1 10*3/uL (ref 0–0.7)
Eosinophils Relative: 1 %
HEMATOCRIT: 41.1 % (ref 35.0–47.0)
HEMOGLOBIN: 13.8 g/dL (ref 12.0–16.0)
Lymphocytes Relative: 14 %
Lymphs Abs: 1.4 10*3/uL (ref 1.0–3.6)
MCH: 27.9 pg (ref 26.0–34.0)
MCHC: 33.7 g/dL (ref 32.0–36.0)
MCV: 82.8 fL (ref 80.0–100.0)
Monocytes Absolute: 0.5 10*3/uL (ref 0.2–0.9)
Monocytes Relative: 5 %
NEUTROS ABS: 7.6 10*3/uL — AB (ref 1.4–6.5)
NEUTROS PCT: 80 %
Platelets: 314 10*3/uL (ref 150–440)
RBC: 4.96 MIL/uL (ref 3.80–5.20)
RDW: 14.4 % (ref 11.5–14.5)
WBC: 9.5 10*3/uL (ref 3.6–11.0)

## 2017-10-04 LAB — URINALYSIS, COMPLETE (UACMP) WITH MICROSCOPIC
BACTERIA UA: NONE SEEN
BILIRUBIN URINE: NEGATIVE
Glucose, UA: NEGATIVE mg/dL
HGB URINE DIPSTICK: NEGATIVE
KETONES UR: 5 mg/dL — AB
LEUKOCYTES UA: NEGATIVE
NITRITE: NEGATIVE
Protein, ur: NEGATIVE mg/dL
SPECIFIC GRAVITY, URINE: 1.012 (ref 1.005–1.030)
pH: 7 (ref 5.0–8.0)

## 2017-10-04 LAB — LIPASE, BLOOD: Lipase: 34 U/L (ref 11–51)

## 2017-10-04 LAB — PREGNANCY, URINE: Preg Test, Ur: NEGATIVE

## 2017-10-04 MED ORDER — ONDANSETRON 4 MG PO TBDP: 4.0000 mg | ORAL_TABLET | Freq: Three times a day (TID) | ORAL | 0 refills | Status: DC | PRN

## 2017-10-04 MED ORDER — FAMOTIDINE IN NACL 20-0.9 MG/50ML-% IV SOLN
20.0000 mg | Freq: Once | INTRAVENOUS | Status: AC
  Administered 2017-10-04: 20 mg via INTRAVENOUS
  Filled 2017-10-04: qty 50

## 2017-10-04 MED ORDER — SODIUM CHLORIDE 0.9 % IV BOLUS
1000.0000 mL | Freq: Once | INTRAVENOUS | Status: AC
  Administered 2017-10-04: 1000 mL via INTRAVENOUS

## 2017-10-04 MED ORDER — ONDANSETRON HCL 4 MG/2ML IJ SOLN
4.0000 mg | Freq: Once | INTRAMUSCULAR | Status: AC
  Administered 2017-10-04: 4 mg via INTRAVENOUS
  Filled 2017-10-04: qty 2

## 2017-10-04 NOTE — ED Triage Notes (Signed)
Patient presents to ED via POV from home with c/o burning abdominal pain that began last night. Patient also reports diarrhea.

## 2017-10-04 NOTE — ED Provider Notes (Signed)
[facility] Emergency Department Provider Note  ____________________________________________  Time seen: Approximately 11:37 AM  I have reviewed the triage vital signs and the nursing notes.   HISTORY  Chief Complaint Abdominal Pain   HPI Crystal Short is a 27 y.o. female with prior history of syphilis and STDs who presents for evaluation of abdominal pain, vomiting and diarrhea.  Patient reports that symptoms started yesterday evening.  She reports the symptoms started after she had [employer].  No other household members with similar symptoms.  She reports several episodes of nonbloody nonbilious emesis, several episodes of watery diarrhea.  No melena, no hematemesis, no coffee-ground emesis.  She is also complaining of burning abdominal pain that is located in the suprapubic region and only present when she vomits.  No sharp cramping abdominal pain.  No fever but she has had chills.  No vaginal discharge, no vaginal bleeding.  Past Medical History:  Diagnosis Date  . Syphilis   . Trichomonas infection     Past Surgical History:  Procedure Laterality Date  . NO PAST SURGERIES      Prior to Admission medications   Medication Sig Start Date End Date Taking? Authorizing Provider  HYDROcodone-acetaminophen (NORCO/VICODIN) 5-325 MG tablet Take 1 tablet by mouth every 4 (four) hours as needed. Patient not taking: Reported on 10/04/2017 01/11/17   Tegeler, Canary Brim, MD  ondansetron (ZOFRAN ODT) 4 MG disintegrating tablet Take 1 tablet (4 mg total) by mouth every 8 (eight) hours as needed for nausea or vomiting. 10/04/17   Don Perking, [place], MD  ondansetron (ZOFRAN) 4 MG tablet Take 1 tablet (4 mg total) by mouth every 8 (eight) hours as needed for nausea or vomiting. Patient not taking: Reported on 10/04/2017 01/11/17   Tegeler, Canary Brim, MD  promethazine (PHENERGAN) 25 MG tablet Take 1 tablet (25 mg total) by mouth every 6 (six) hours as needed for  nausea or vomiting. Patient not taking: Reported on 10/04/2017 01/13/17   Lavera Guise, MD    Allergies Orange fruit [citrus]  Family History  Problem Relation Age of Onset  . Diabetes Mother   . Diabetes Brother   . Diabetes Maternal Grandmother     Social History Social History   Tobacco Use  . Smoking status: Never Smoker  . Smokeless tobacco: Never Used  Substance Use Topics  . Alcohol use: Yes    Comment: twice a week  . Drug use: Yes    Types: Marijuana    Review of Systems  Constitutional: Negative for fever. Eyes: Negative for visual changes. ENT: Negative for sore throat. Neck: No neck pain  Cardiovascular: Negative for chest pain. Respiratory: Negative for shortness of breath. Gastrointestinal: + abdominal pain, vomiting and diarrhea. Genitourinary: Negative for dysuria. Musculoskeletal: Negative for back pain. Skin: Negative for rash. Neurological: Negative for headaches, weakness or numbness. Psych: No SI or HI  ____________________________________________   PHYSICAL EXAM:  VITAL SIGNS: ED Triage Vitals [10/04/17 1057]  Enc Vitals Group     BP 111/73     Pulse Rate 74     Resp 16     Temp 98.5 F (36.9 C)     Temp Source Oral     SpO2 100 %     Weight 132 lb (59.9 kg)     Height  (1.651 m)     Head Circumference      Peak Flow      Pain Score 10     Pain Loc  Pain Edu?      Excl. in GC?     Constitutional: Alert and oriented. Well appearing and in no apparent distress. HEENT:      Head: Normocephalic and atraumatic.         Eyes: Conjunctivae are normal. Sclera is non-icteric.       Mouth/Throat: Mucous membranes are moist.       Neck: Supple with no signs of meningismus. Cardiovascular: Regular rate and rhythm. No murmurs, gallops, or rubs. 2+ symmetrical distal pulses are present in all extremities. No JVD. Respiratory: Normal respiratory effort. Lungs are clear to auscultation bilaterally. No wheezes, crackles, or  rhonchi.  Gastrointestinal: Soft, mild suprapubic ttp, and non distended with positive bowel sounds. No rebound or guarding. Genitourinary: No CVA tenderness. Musculoskeletal: Nontender with normal range of motion in all extremities. No edema, cyanosis, or erythema of extremities. Neurologic: Normal speech and language. Face is symmetric. Moving all extremities. No gross focal neurologic deficits are appreciated. Skin: Skin is warm, dry and intact. No rash noted. Psychiatric: Mood and affect are normal. Speech and behavior are normal.  ____________________________________________   LABS (all labs ordered are listed, but only abnormal results are displayed)  Labs Reviewed  CBC WITH DIFFERENTIAL/PLATELET - Abnormal; Notable for the following components:      Result Value   Neutro Abs 7.6 (*)    All other components within normal limits  COMPREHENSIVE METABOLIC PANEL - Abnormal; Notable for the following components:   Glucose, Bld 101 (*)    Total Protein 8.6 (*)    ALT 10 (*)    All other components within normal limits  URINALYSIS, COMPLETE (UACMP) WITH MICROSCOPIC - Abnormal; Notable for the following components:   Color, Urine YELLOW (*)    APPearance CLOUDY (*)    Ketones, ur 5 (*)    All other components within normal limits  LIPASE, BLOOD  PREGNANCY, URINE   ____________________________________________  EKG  none  ____________________________________________  RADIOLOGY  none  ____________________________________________   PROCEDURES  Procedure(s) performed: None Procedures Critical Care performed:  None ____________________________________________   INITIAL IMPRESSION / ASSESSMENT AND PLAN / ED COURSE  27 y.o. female with prior history of syphilis and STDs who presents for evaluation of abdominal pain, vomiting and diarrhea.  Patient is extremely well-appearing, no distress, normal vital signs, abdomen is soft with mild suprapubic tenderness, no rebound or  guarding.  Differential diagnosis including viral gastroenteritis versus food poisoning versus colitis versus enteritis versus gastritis versus UTI.  No clinical signs or symptoms of appendicitis, ovarian pathology, gallbladder disease, STD.  Plan for CBC, CMP, lipase, urinalysis.  Will give IV fluids and Zofran.    _________________________ 1:45 PM on 10/04/2017 -----------------------------------------  Labs all within normal limits.  Patient is tolerating p.o.  No further episodes of vomiting in the emergency room.  Patient had only one bowel movement in the 3 hours that she is been in the emergency room.  Recommended increase oral hydration.  Patient was given a prescription for Zofran.  Discuss return precautions for new or worsening abdominal pain or signs of dehydration and recommended that she return to the emergency room if these develop.  Otherwise she will follow-up with her primary care doctor.   As part of my medical decision making, I reviewed the following data within the electronic MEDICAL RECORD NUMBER Nursing notes reviewed and incorporated, Labs reviewed , Old chart reviewed, Notes from prior ED visits and [place] Controlled Substance Database    Pertinent labs & imaging results  that were available during my care of the patient were reviewed by me and considered in my medical decision making (see chart for details).    ____________________________________________   FINAL CLINICAL IMPRESSION(S) / ED DIAGNOSES  Final diagnoses:  Viral gastroenteritis      NEW MEDICATIONS STARTED DURING THIS VISIT:  ED Discharge Orders        Ordered    ondansetron (ZOFRAN ODT) 4 MG disintegrating tablet  Every 8 hours PRN     10/04/17 1343       Note:  This document was prepared using Dragon voice recognition software and may include unintentional dictation errors.    Don Perking, [place], MD 10/04/17 1345

## 2017-11-09 IMAGING — CT CT ABD-PELV W/ CM
2 of 4 series · 16 of 46 positions shown, 18 images · IV contrast (iopamidol)
Comparison: Ultrasound from earlier in the same day

CLINICAL DATA: Vaginal bleeding, history of positive pregnancy test
previously

EXAM:
CT ABDOMEN AND PELVIS WITH CONTRAST
TECHNIQUE: Multidetector CT imaging of the abdomen and pelvis was performed
using the standard protocol following bolus administration of
intravenous contrast.
CONTRAST:  100mL [id number] IOPAMIDOL ([id number]) INJECTION 61%

[Series 2: routine abd/pel with · axial · 0.63mm/px · z∈[-1208,-783]mm · 13 of 93 slices shown, 15 images]
[im 4/93  soft-tissue]
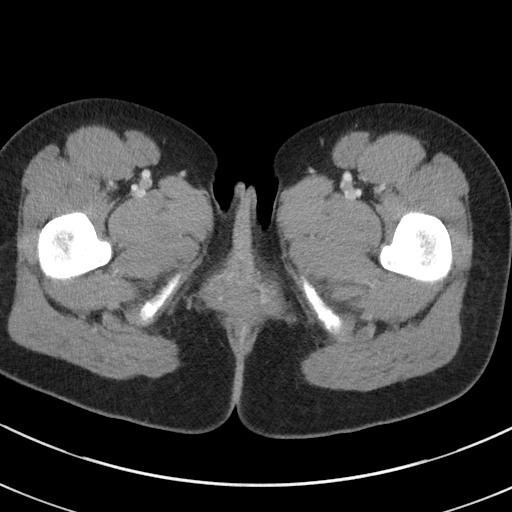
[im 4/93  bone]
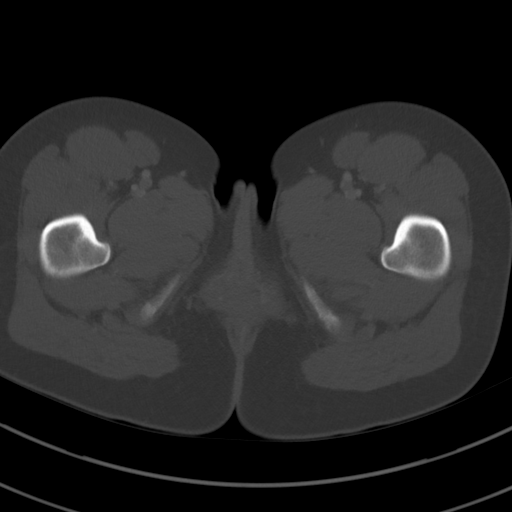
[im 12/93  soft-tissue]
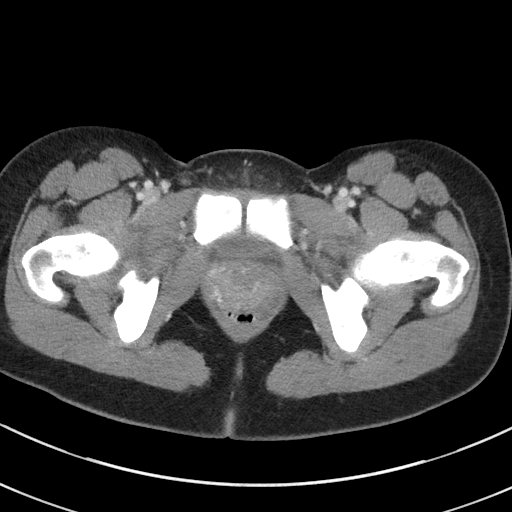
[im 19/93  soft-tissue]
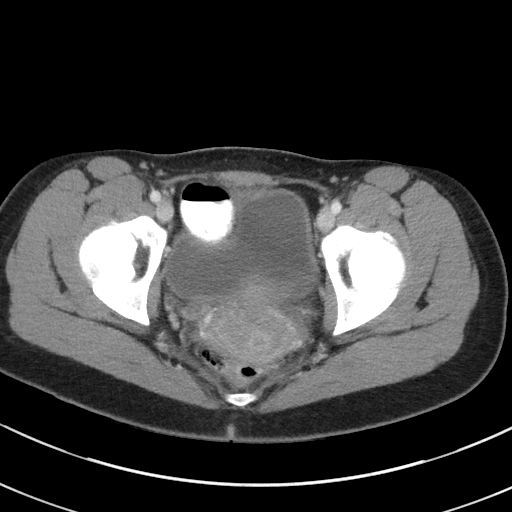
[im 26/93  soft-tissue]
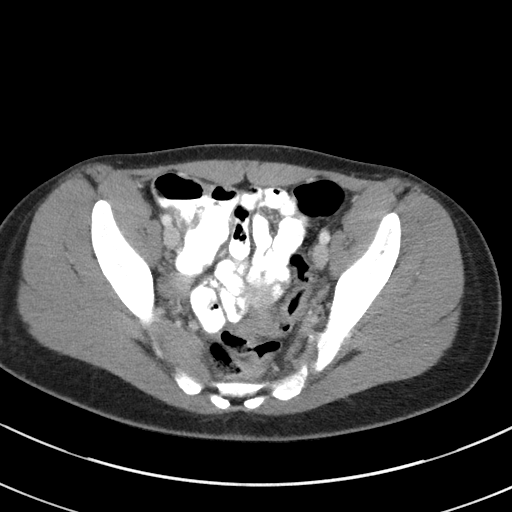
[im 34/93  soft-tissue]
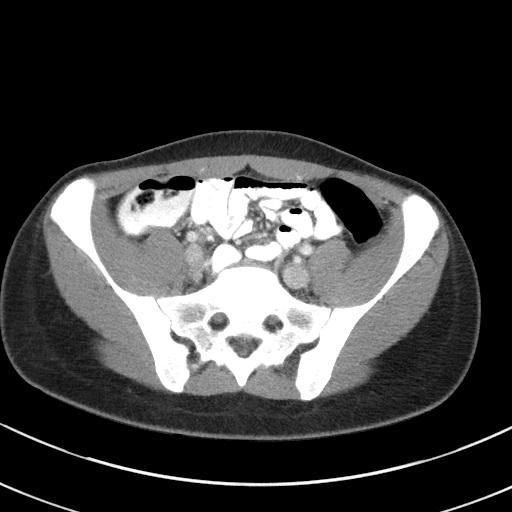
[im 41/93  soft-tissue]
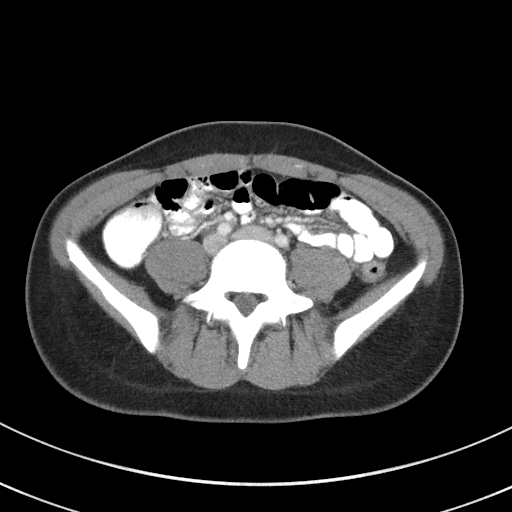
[im 48/93  soft-tissue]
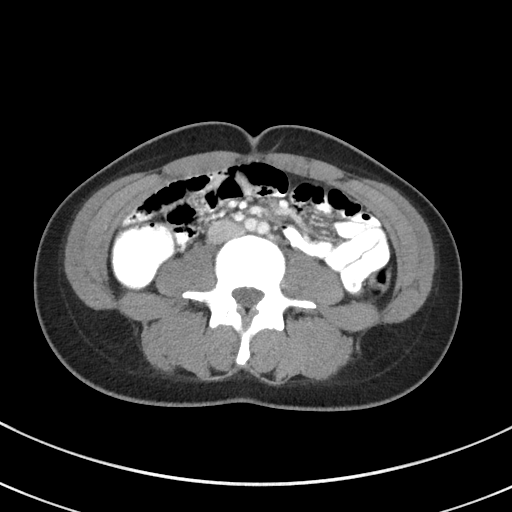
[im 52/93  soft-tissue]
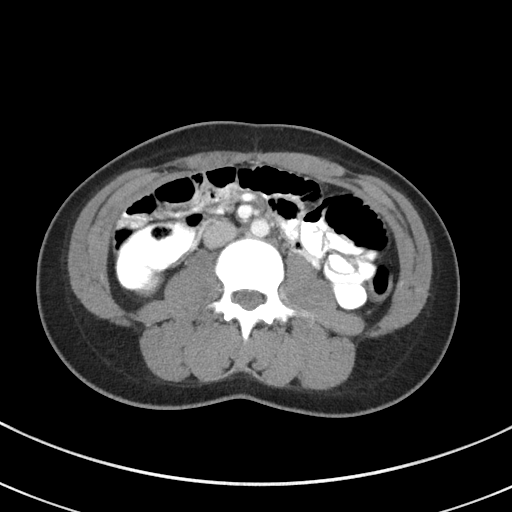
[im 59/93  soft-tissue]
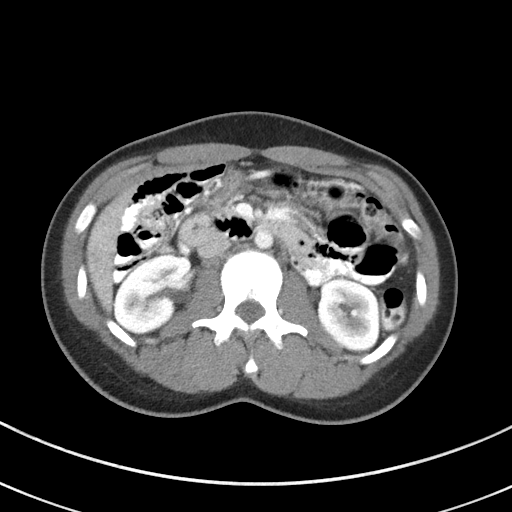
[im 59/93  bone]
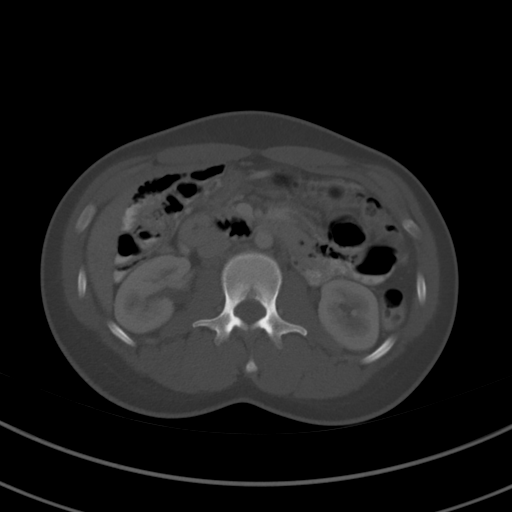
[im 67/93  soft-tissue]
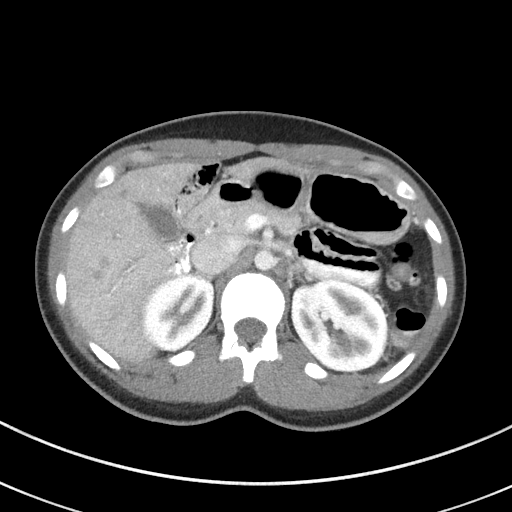
[im 74/93  soft-tissue]
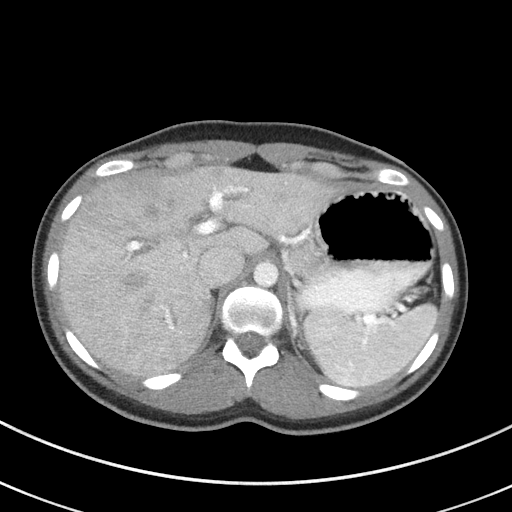
[im 81/93  soft-tissue]
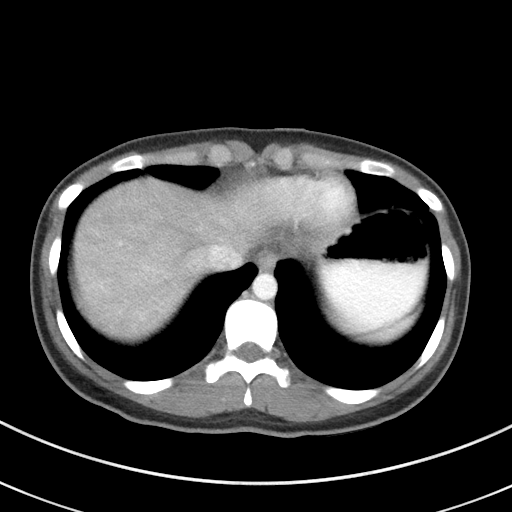
[im 89/93  soft-tissue]
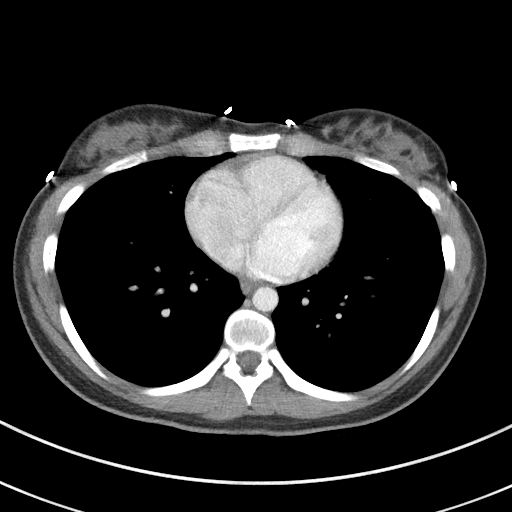

[Series 5: coronal st · coronal · 0.63mm/px · 3 of 69 slices shown]
[im 23/69  soft-tissue]
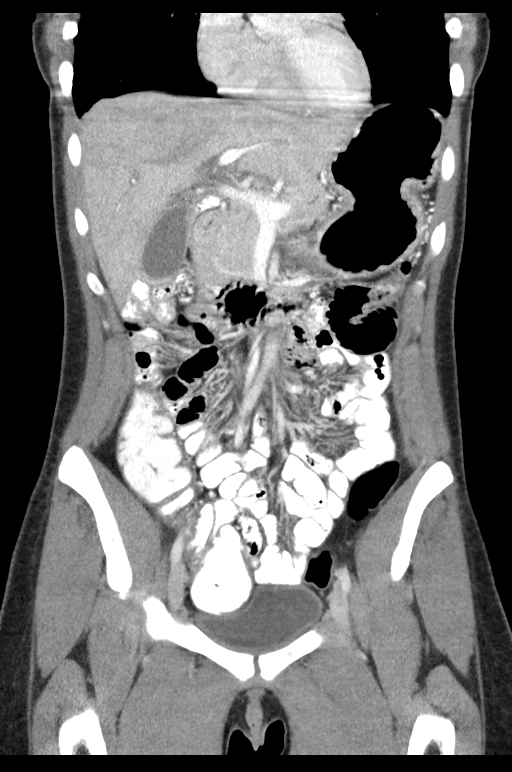
[im 31/69  soft-tissue]
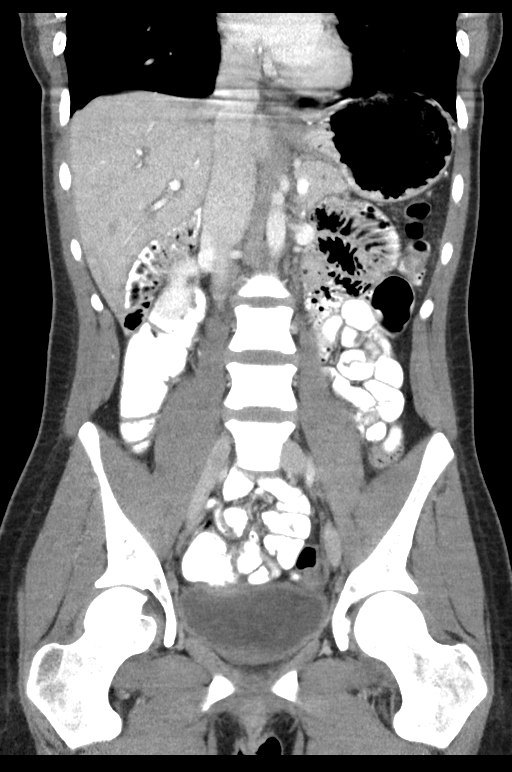
[im 38/69  soft-tissue]
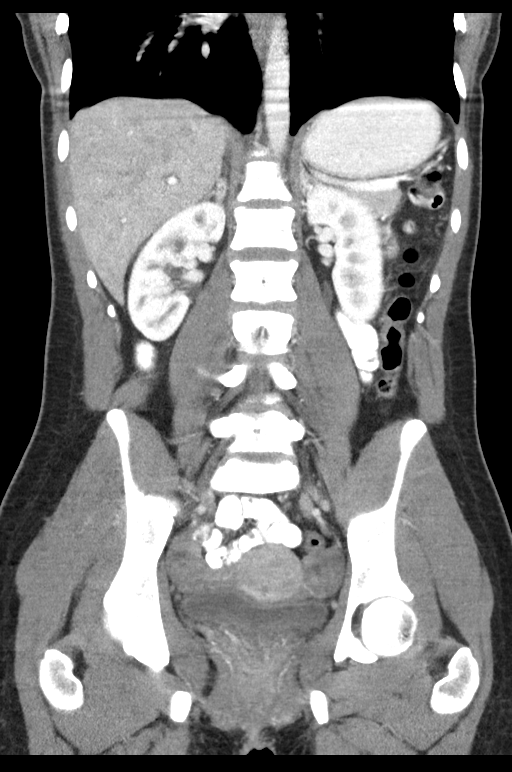

[16 of 46 positions shown; findings below may reference images not displayed]

FINDINGS: Lower chest: No acute abnormality.

Hepatobiliary: No focal liver abnormality is seen. No gallstones,
gallbladder wall thickening, or biliary dilatation.

Pancreas: Unremarkable. No pancreatic ductal dilatation or
surrounding inflammatory changes.

Spleen: Normal in size without focal abnormality.

Adrenals/Urinary Tract: Adrenal glands are unremarkable. Kidneys are
normal, without renal calculi, focal lesion, or hydronephrosis.
Bladder is unremarkable.

Stomach/Bowel: Stomach is within normal limits. Appendix appears
normal. No evidence of bowel wall thickening, distention, or
inflammatory changes.

Vascular/Lymphatic: No significant vascular findings are present. No
enlarged abdominal or pelvic lymph nodes.

Reproductive: Uterus and bilateral adnexa are unremarkable.

Other: Minimal free pelvic fluid is noted similar to that seen on
the prior ultrasound. This is likely physiologic in nature.

Musculoskeletal: No acute or significant osseous findings.
IMPRESSION: No acute abnormality noted.

## 2018-04-29 ENCOUNTER — Other Ambulatory Visit: Payer: Self-pay

## 2018-04-29 ENCOUNTER — Emergency Department (HOSPITAL_BASED_OUTPATIENT_CLINIC_OR_DEPARTMENT_OTHER)
Admission: EM | Admit: 2018-04-29 | Discharge: 2018-04-29 | Disposition: A | Discharge status: Home / Self Care | Payer: Self-pay | Attending: Emergency Medicine | Admitting: Emergency Medicine

## 2018-04-29 ENCOUNTER — Encounter (HOSPITAL_BASED_OUTPATIENT_CLINIC_OR_DEPARTMENT_OTHER): Payer: Self-pay | Admitting: Emergency Medicine

## 2018-04-29 DIAGNOSIS — Z5321 Procedure and treatment not carried out due to patient leaving prior to being seen by health care provider: Secondary | ICD-10-CM | POA: Insufficient documentation

## 2018-04-29 DIAGNOSIS — R1110 Vomiting, unspecified: Secondary | ICD-10-CM | POA: Insufficient documentation

## 2018-04-29 LAB — COMPREHENSIVE METABOLIC PANEL
ALBUMIN: 4.3 g/dL (ref 3.5–5.0)
ALT: 14 U/L (ref 0–44)
ANION GAP: 9 (ref 5–15)
AST: 27 U/L (ref 15–41)
Alkaline Phosphatase: 48 U/L (ref 38–126)
BILIRUBIN TOTAL: 0.3 mg/dL (ref 0.3–1.2)
BUN: 9 mg/dL (ref 6–20)
CO2: 27 mmol/L (ref 22–32)
Calcium: 9.9 mg/dL (ref 8.9–10.3)
Chloride: 104 mmol/L (ref 98–111)
Creatinine, Ser: 0.77 mg/dL (ref 0.44–1.00)
GFR calc Af Amer: >60 mL/min (ref >60)
GFR calc non Af Amer: >60 mL/min (ref >60)
Glucose, Bld: 114 mg/dL — ABNORMAL HIGH (ref 70–99)
POTASSIUM: 4.1 mmol/L (ref 3.5–5.1)
SODIUM: 140 mmol/L (ref 135–145)
TOTAL PROTEIN: 8.2 g/dL — AB (ref 6.5–8.1)

## 2018-04-29 LAB — CBC
HCT: 43.6 % (ref 36.0–46.0)
HEMOGLOBIN: 14.3 g/dL (ref 12.0–15.0)
MCH: 27.6 pg (ref 26.0–34.0)
MCHC: 32.8 g/dL (ref 30.0–36.0)
MCV: 84 fL (ref 80.0–100.0)
NRBC: 0 % (ref 0.0–0.2)
Platelets: 260 10*3/uL (ref 150–400)
RBC: 5.19 MIL/uL — AB (ref 3.87–5.11)
RDW: 12.8 % (ref 11.5–15.5)
WBC: 5.3 10*3/uL (ref 4.0–10.5)

## 2018-04-29 LAB — LIPASE, BLOOD: Lipase: 63 U/L — ABNORMAL HIGH (ref 11–51)

## 2018-04-29 MED ORDER — ONDANSETRON 4 MG PO TBDP
4.0000 mg | ORAL_TABLET | Freq: Once | ORAL | Status: AC | PRN
  Administered 2018-04-29: 4 mg via ORAL
  Filled 2018-04-29: qty 1

## 2018-04-29 NOTE — ED Triage Notes (Signed)
Pt has been having cold/flu symptoms for two weeks.  Today she vomited up some blood and started having a nose bleed.

## 2018-05-20 LAB — HM PAP SMEAR: HM Pap smear: NEGATIVE

## 2018-08-03 IMAGING — US US ART/VEN ABD/PELV/SCROTUM DOPPLER LTD
1 series · 13 of 25 positions shown · non-contrast
Comparison: 10/15/2010 ultrasound

CLINICAL DATA: 25-year-old female with right pelvic pain and
vaginal bleeding for 1 day.



[Series 1: us art/ven abd/pelv/scrotum doppler ltd · 0.20mm/px · 13 of 80 slices shown]
[im 1/80]
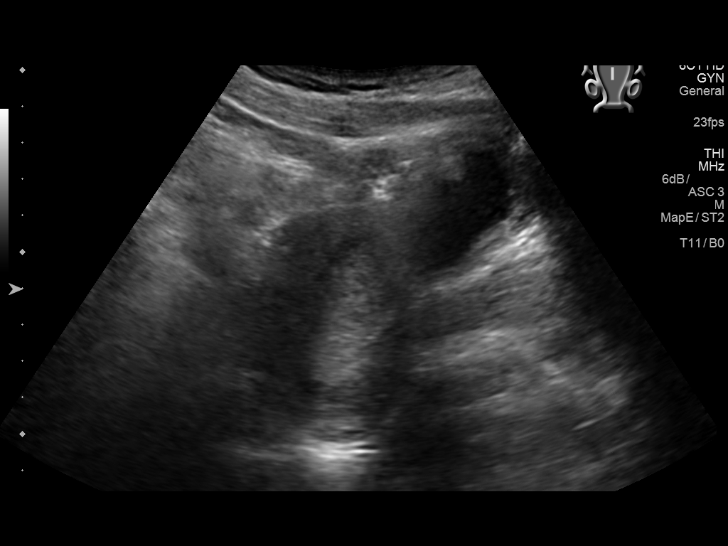
[im 7/80]
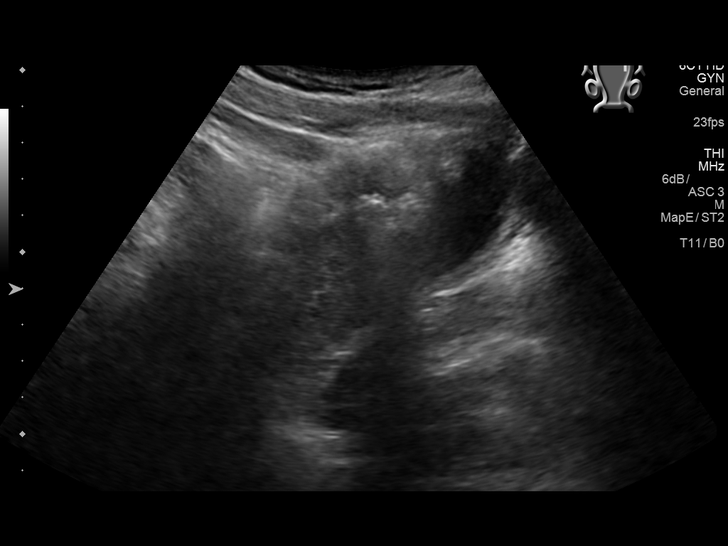
[im 14/80]
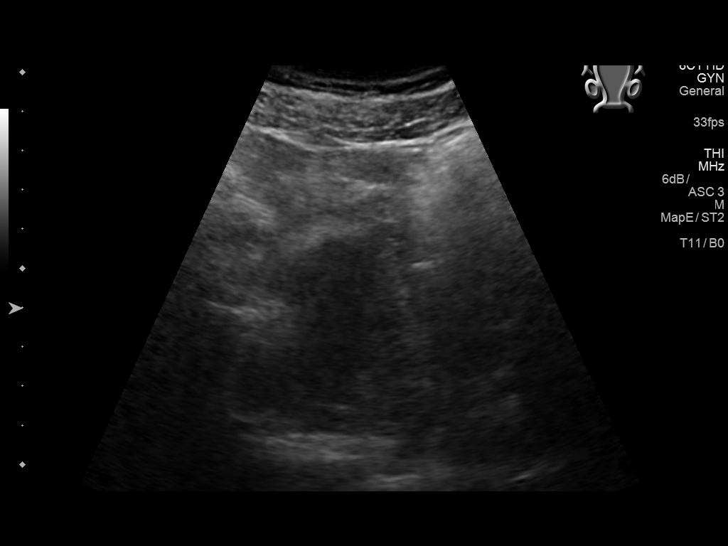
[im 20/80]
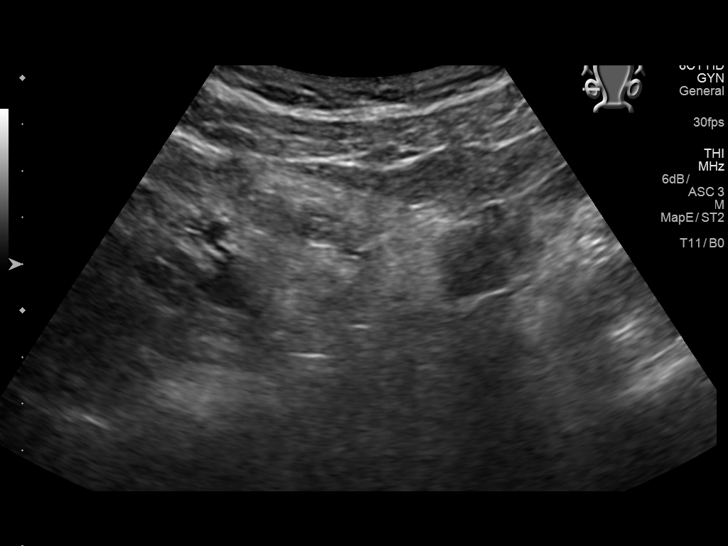
[im 27/80]
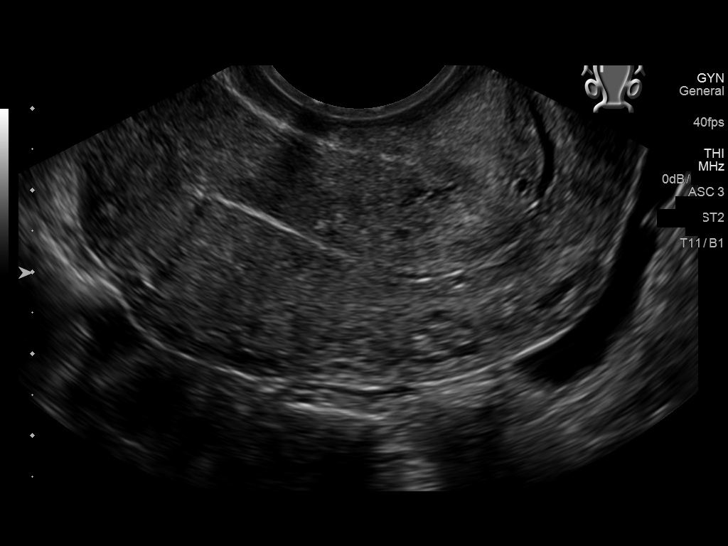
[im 33/80]
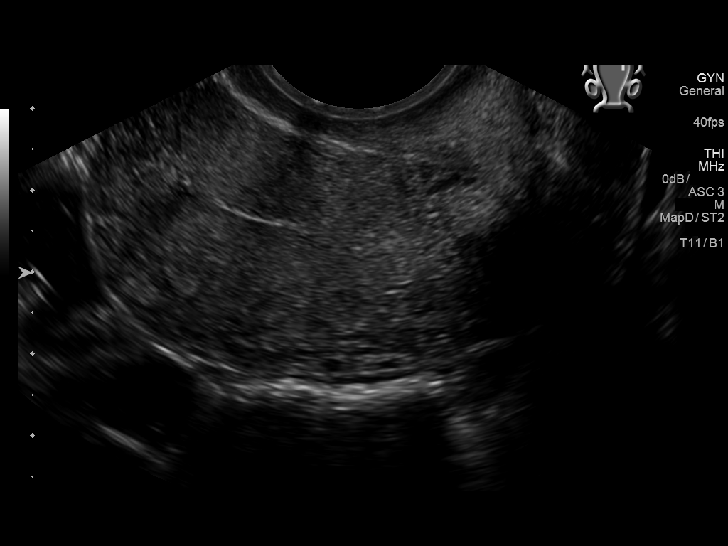
[im 40/80]
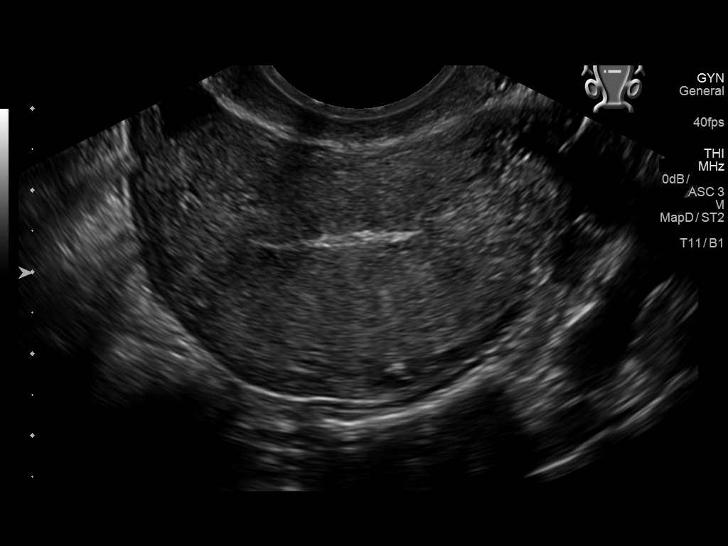
[im 47/80]
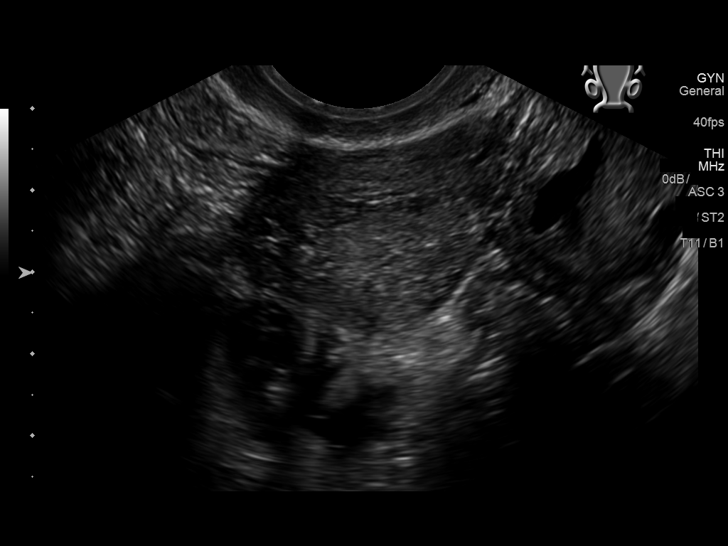
[im 53/80]
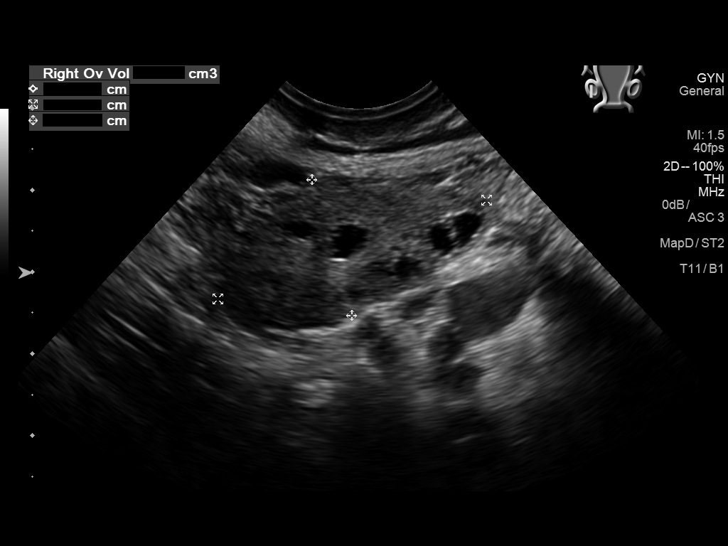
[im 60/80]
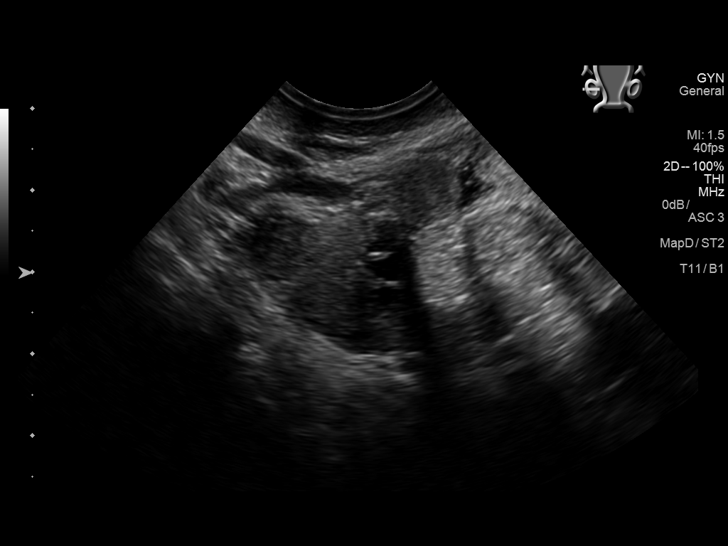
[im 66/80]
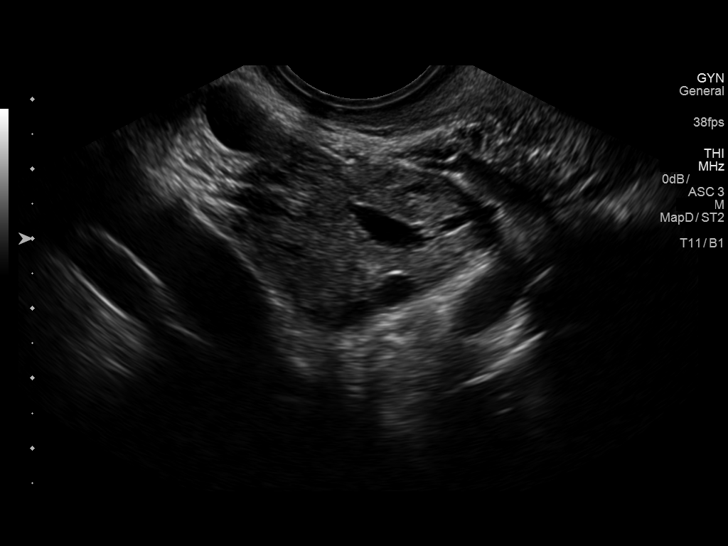
[im 73/80]
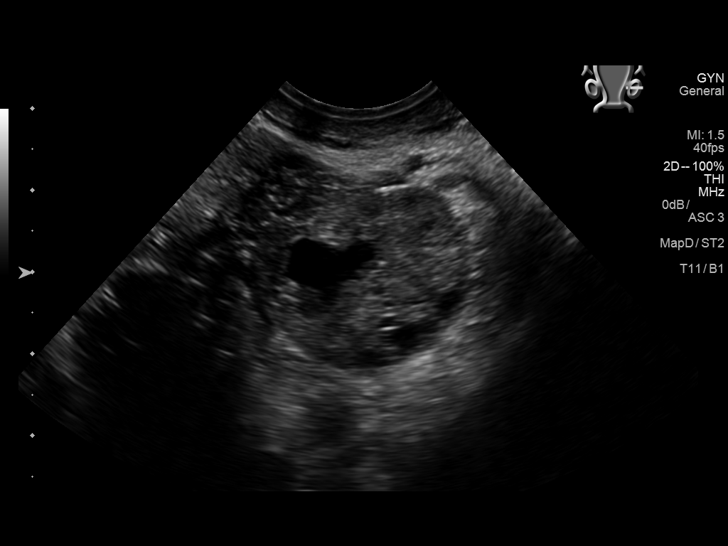
[im 80/80]
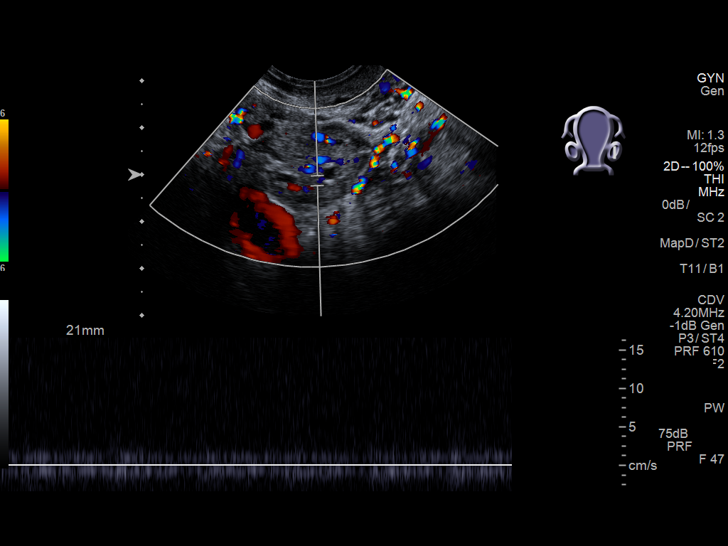

[13 of 25 positions shown; findings below may reference images not displayed]

FINDINGS: Uterus

Measurements: 7.8 x 3.6 x 5.2 cm and anteverted. No fibroids or
other mass visualized.

Endometrium

Thickness: 1.3 mm.  No focal abnormality visualized.

Right ovary

Measurements: 3.5 x 1.7 x 2.4 cm. Normal appearance/no suspicious
adnexal mass.

Left ovary

Measurements: 3.8 x 2.4 x 2.7 cm. Normal appearance/no suspicious
adnexal mass.

Pulsed Doppler evaluation of both ovaries demonstrates normal
low-resistance arterial and venous waveforms.

Other findings

A small amount of free fluid may be physiologic.
IMPRESSION: Small amount of free fluid which may be physiologic.

No other significant abnormalities.

## 2018-08-07 LAB — HM HIV SCREENING LAB: HM HIV Screening: NEGATIVE

## 2018-11-19 ENCOUNTER — Encounter: Payer: Self-pay | Admitting: Emergency Medicine

## 2018-11-19 ENCOUNTER — Other Ambulatory Visit: Payer: Self-pay

## 2018-11-19 ENCOUNTER — Emergency Department
Admission: EM | Admit: 2018-11-19 | Discharge: 2018-11-19 | Disposition: A | Discharge status: Home / Self Care | Payer: Self-pay | Attending: Student in an Organized Health Care Education/Training Program | Admitting: Student in an Organized Health Care Education/Training Program

## 2018-11-19 DIAGNOSIS — B9689 Other specified bacterial agents as the cause of diseases classified elsewhere: Secondary | ICD-10-CM | POA: Insufficient documentation

## 2018-11-19 DIAGNOSIS — L089 Local infection of the skin and subcutaneous tissue, unspecified: Secondary | ICD-10-CM | POA: Insufficient documentation

## 2018-11-19 DIAGNOSIS — F1210 Cannabis abuse, uncomplicated: Secondary | ICD-10-CM | POA: Insufficient documentation

## 2018-11-19 MED ORDER — SULFAMETHOXAZOLE-TRIMETHOPRIM 800-160 MG PO TABS: 1.0000 | ORAL_TABLET | Freq: Two times a day (BID) | ORAL | 0 refills | Status: DC

## 2018-11-19 MED ORDER — HYDROXYZINE HCL 50 MG PO TABS: 50.0000 mg | ORAL_TABLET | Freq: Three times a day (TID) | ORAL | 0 refills | Status: DC | PRN

## 2018-11-19 NOTE — Discharge Instructions (Addendum)
Follow discharge care instruction take medication as directed.  Use antibacterial soap as directed.

## 2018-11-19 NOTE — ED Notes (Signed)
See triage note  Presents with several small areas of rash to lower abd and groin area  Positive itching and pain  afebrile

## 2018-11-19 NOTE — ED Triage Notes (Signed)
C/o burning, itching rash / areas to groin x 3 days.

## 2018-11-19 NOTE — ED Notes (Signed)
Assessment of rash by PA Tamala Julian with this RN at bedside

## 2018-11-19 NOTE — ED Provider Notes (Signed)
[facility] Emergency Department Provider Note   ____________________________________________   First MD Initiated Contact with Patient 11/19/18 1112     (approximate)  I have reviewed the triage vital signs and the nursing notes.   HISTORY  Chief Complaint Rash    HPI Thomes DinningJasmine Sobieski is a 28 y.o. female patient complaints of burning itchy rash to the groin area for 3 days.  Patient states she noticed itching after shaving of the groin area approximately week ago.  Denies fever chills associated with complaint.  Patient denies drainage.  Patient denies pain.  No palliative measure for complaint.  No palliative measures for complaint.         Past Medical History:  Diagnosis Date  . Syphilis   . Trichomonas infection     There are no active problems to display for this patient.   Past Surgical History:  Procedure Laterality Date  . NO PAST SURGERIES      Prior to Admission medications   Medication Sig Start Date End Date Taking? Authorizing Provider  hydrOXYzine (ATARAX/VISTARIL) 50 MG tablet Take 1 tablet (50 mg total) by mouth 3 (three) times daily as needed for itching. 11/19/18   Joni ReiningSmith, Zendaya Groseclose K, PA-C  sulfamethoxazole-trimethoprim (BACTRIM DS) 800-160 MG tablet Take 1 tablet by mouth 2 (two) times daily. 11/19/18   Joni ReiningSmith, Deston Bilyeu K, PA-C    Allergies Orange fruit [citrus]  Family History  Problem Relation Age of Onset  . Diabetes Mother   . Diabetes Brother   . Diabetes Maternal Grandmother     Social History Social History   Tobacco Use  . Smoking status: Never Smoker  . Smokeless tobacco: Never Used  Substance Use Topics  . Alcohol use: Yes    Comment: twice a week  . Drug use: Yes    Types: Marijuana    Review of Systems  Constitutional: No fever/chills Eyes: No visual changes. ENT: No sore throat. Cardiovascular: Denies chest pain. Respiratory: Denies shortness of breath. Gastrointestinal: No abdominal pain.  No  nausea, no vomiting.  No diarrhea.  No constipation. Genitourinary: Negative for dysuria. Musculoskeletal: Negative for back pain. Skin: Positive for rash. Neurological: Negative for headaches, focal weakness or numbness. Allergic/Immunilogical: Citrus fruits. ____________________________________________   PHYSICAL EXAM:  VITAL SIGNS: ED Triage Vitals  Enc Vitals Group     BP 11/19/18 1045 112/76     Pulse Rate 11/19/18 1045 62     Resp 11/19/18 1045 16     Temp 11/19/18 1045 98.9 F (37.2 C)     Temp Source 11/19/18 1045 Oral     SpO2 11/19/18 1045 100 %     Weight 11/19/18 1043 130 lb (59 kg)     Height 11/19/18 1043 5\' 5"  (1.651 m)     Head Circumference --      Peak Flow --      Pain Score 11/19/18 1043 0     Pain Loc --      Pain Edu? --      Excl. in GC? --     Constitutional: Alert and oriented. Well appearing and in no acute distress. Cardiovascular: Normal rate, regular rhythm. Grossly normal heart sounds.  Good peripheral circulation. Respiratory: Normal respiratory effort.  No retractions. Lungs CTAB. Musculoskeletal: No lower extremity tenderness nor edema.  No joint effusions. Neurologic:  Normal speech and language. No gross focal neurologic deficits are appreciated. No gait instability. Skin: Multiple papular lesion on erythematous base.  Psychiatric: Mood and affect are normal.  Speech and behavior are normal.  ____________________________________________   LABS (all labs ordered are listed, but only abnormal results are displayed)  Labs Reviewed - No data to display ____________________________________________  EKG   ____________________________________________  RADIOLOGY  ED MD interpretation:    Official radiology report(s): No results found.  ____________________________________________   PROCEDURES  Procedure(s) performed (including Critical Care):  Procedures   ____________________________________________   INITIAL IMPRESSION  / ASSESSMENT AND PLAN / ED COURSE  As part of my medical decision making, I reviewed the following data within the [place] was evaluated in Emergency Department on 11/19/2018 for the symptoms described in the history of present illness. She was evaluated in the context of the global COVID-19 pandemic, which necessitated consideration that the patient might be at risk for infection with the SARS-CoV-2 virus that causes COVID-19. Institutional protocols and algorithms that pertain to the evaluation of patients at risk for COVID-19 are in a state of rapid change based on information released by regulatory bodies including the CDC and federal and state organizations. These policies and algorithms were followed during the patient's care in the ED.    Patient presents for itchy rash status post shaving of the groin area.  Physical exam is consistent with bacterial skin infection.  Patient given discharge care instructions and advised to not shave the area for least 2 weeks.  Medication as directed.  [facility].   ____________________________________________   FINAL CLINICAL IMPRESSION(S) / ED DIAGNOSES  Final diagnoses:  Bacterial infection of skin     ED Discharge Orders         Ordered    sulfamethoxazole-trimethoprim (BACTRIM DS) 800-160 MG tablet  2 times daily     11/19/18 1117    hydrOXYzine (ATARAX/VISTARIL) 50 MG tablet  3 times daily PRN     11/19/18 1117           Note:  This document was prepared using Dragon voice recognition software and may include unintentional dictation errors.    Sable Feil, PA-C 11/19/18 1124    Merlyn Lot, MD 11/19/18 1233

## 2018-11-22 DIAGNOSIS — D573 Sickle-cell trait: Secondary | ICD-10-CM

## 2018-11-25 ENCOUNTER — Ambulatory Visit: Payer: Self-pay

## 2018-12-19 IMAGING — US US ART/VEN ABD/PELV/SCROTUM DOPPLER LTD
1 series · 13 of 25 positions shown · non-contrast
Comparison: Pelvic ultrasound August 26, 2016

CLINICAL DATA: Right-sided pelvic pain ; history of sexually
transmitted disease with recent treatment for simple S. onset of
last normal menstrual period was January 03, 2017.

EXAM:
TRANSABDOMINAL AND TRANSVAGINAL ULTRASOUND OF PELVIS
DOPPLER ULTRASOUND OF OVARIES
TECHNIQUE: Both transabdominal and transvaginal ultrasound examinations of the
pelvis were performed. Transabdominal technique was performed for
global imaging of the pelvis including uterus, ovaries, adnexal
regions, and pelvic cul-de-sac.
It was necessary to proceed with endovaginal exam following the
transabdominal exam to visualize the uterus, endometrium, ovaries,
and adnexal regions.. Color and duplex Doppler ultrasound was
utilized to evaluate blood flow to the ovaries.

[Series 1: us art/ven abd/pelv/scrotum doppler ltd · 0.24mm/px · 13 of 109 slices shown]
[im 1/109]
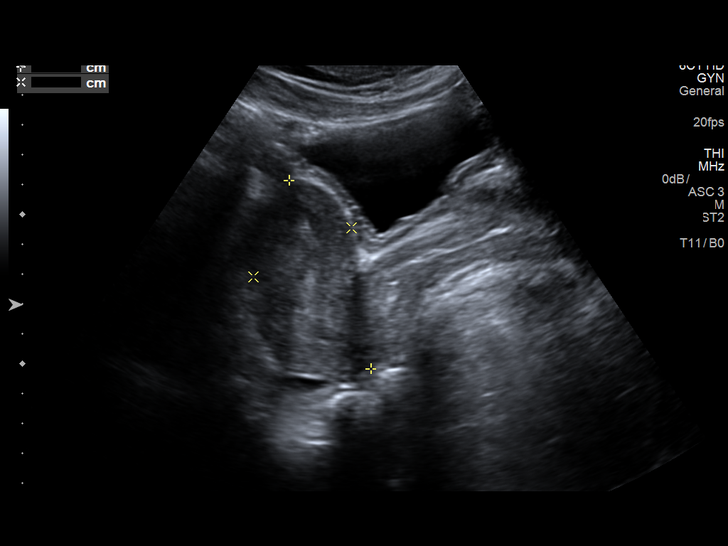
[im 10/109]
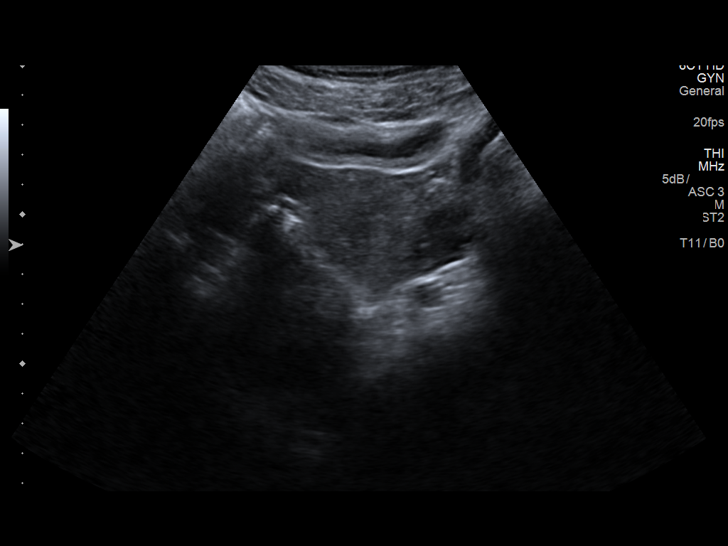
[im 19/109]
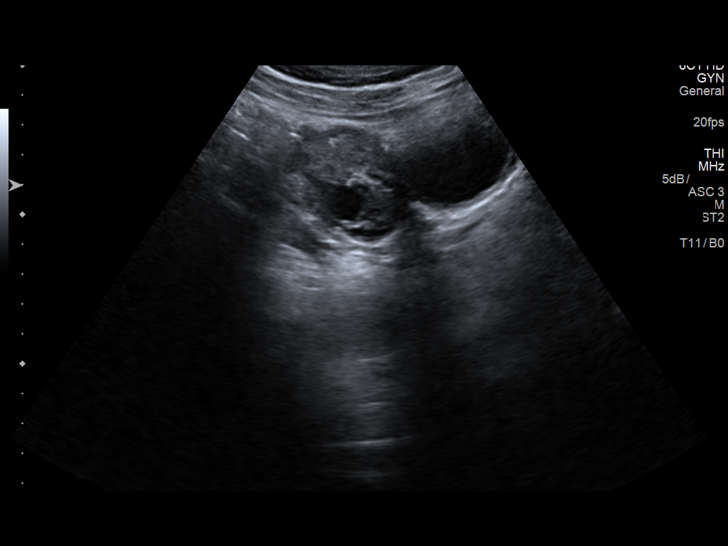
[im 28/109]
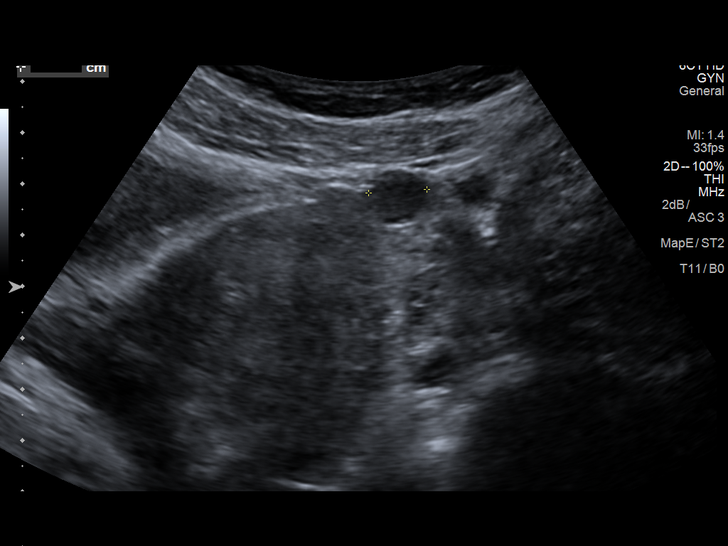
[im 37/109]
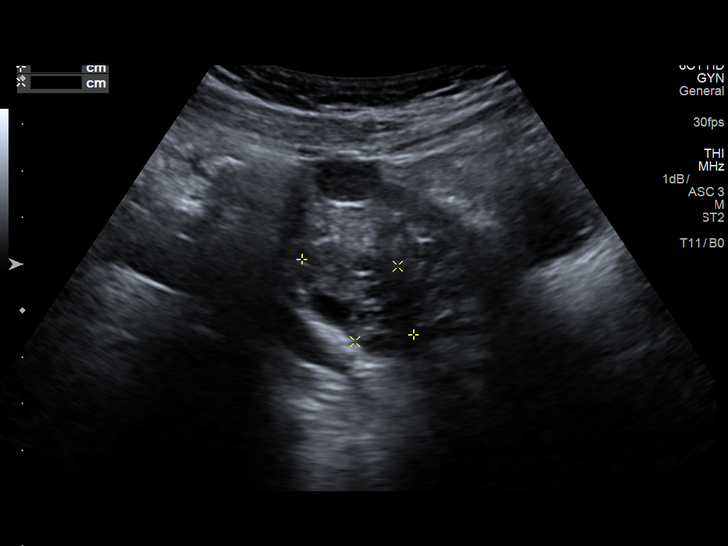
[im 46/109]
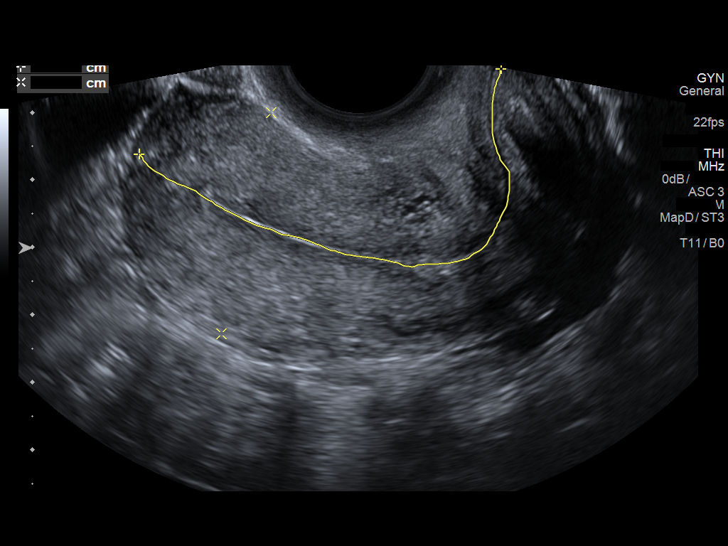
[im 55/109]
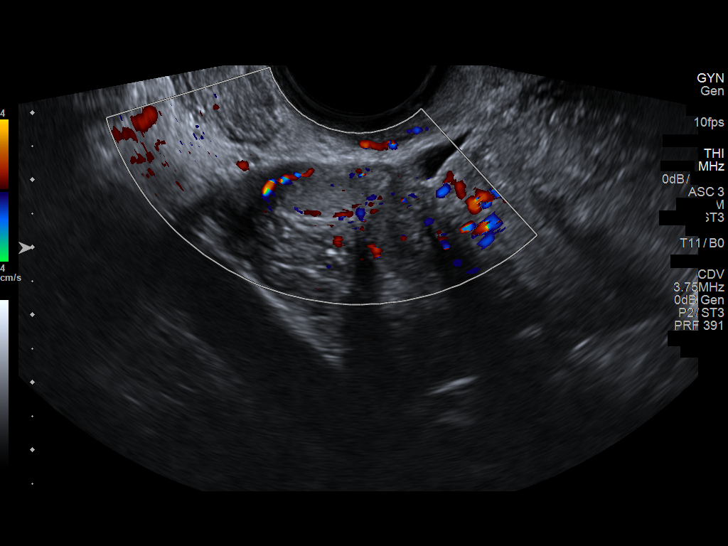
[im 64/109]
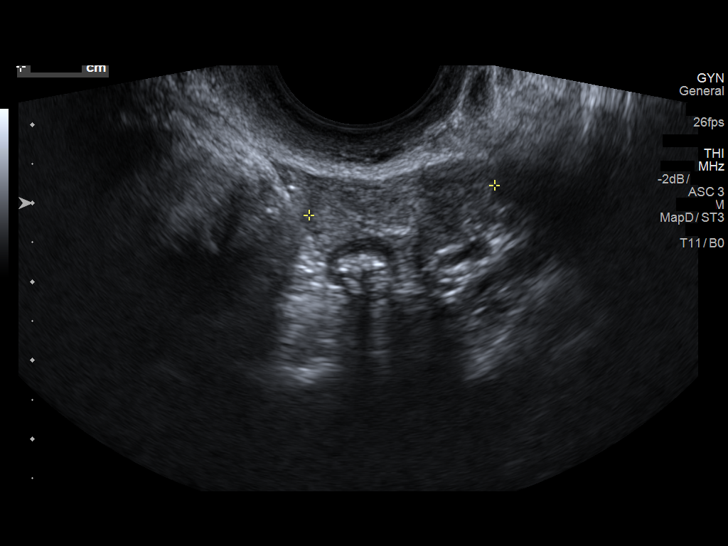
[im 73/109]
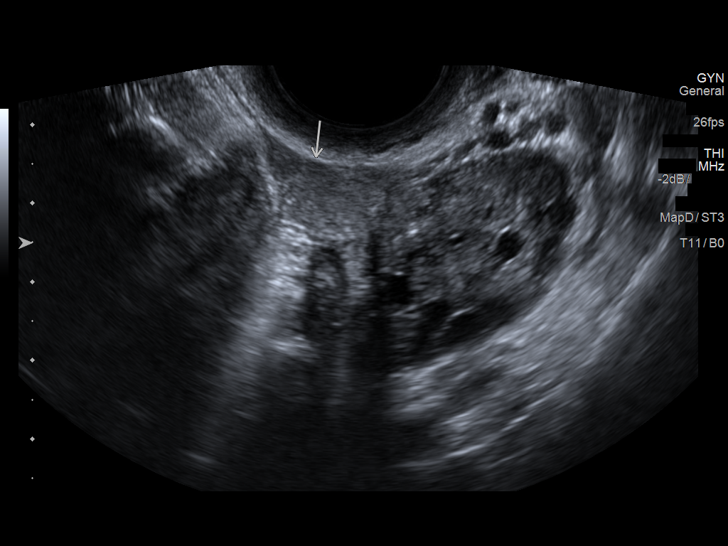
[im 82/109]
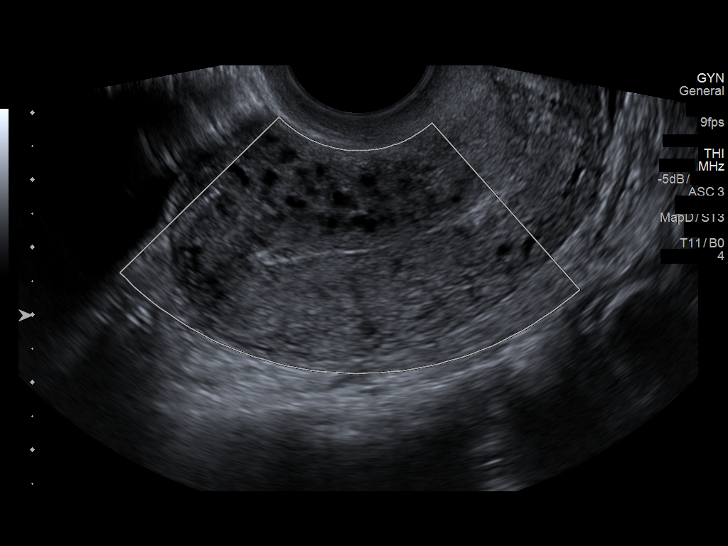
[im 91/109]
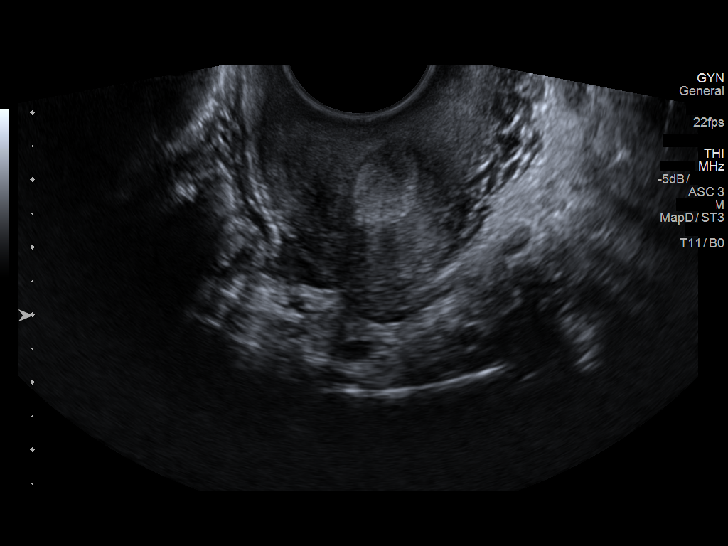
[im 100/109]
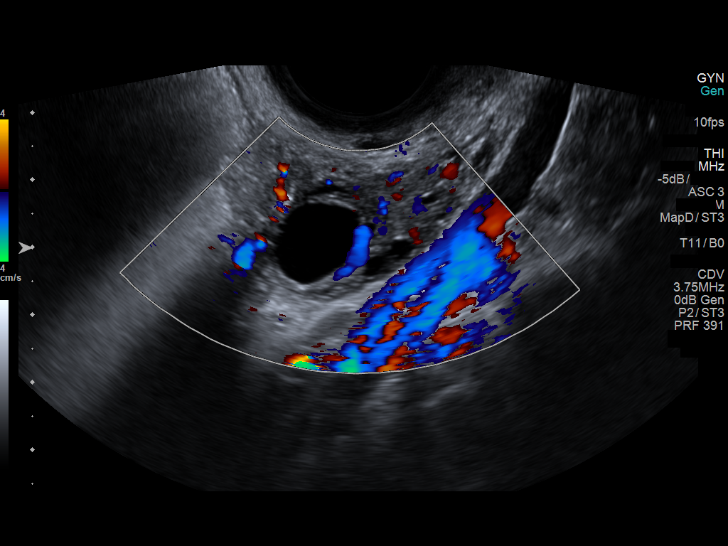
[im 109/109]
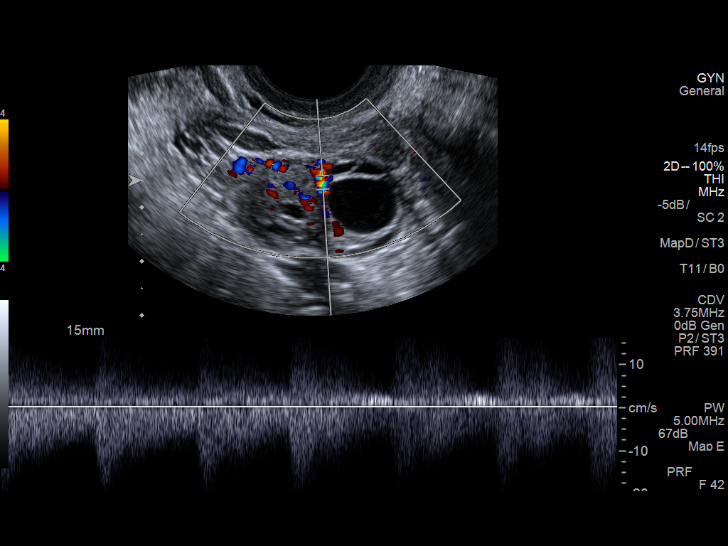

[13 of 25 positions shown; findings below may reference images not displayed]

FINDINGS: Uterus

Measurements: 8.4 x 3.4 x 4.6 cm. No fibroids or other mass
visualized.

Endometrium

Thickness: 6.1 mm.  No focal abnormality visualized.

Right ovary

Measurements: 3.3 x 1.8 x 3.0 cm. Normal appearance/no adnexal mass.

Left ovary

Measurements: 3.8 x 1.8 x 3.7 cm. There is a normal appearance of
the left ovary with developing follicles. Adjacent to the left ovary
there is a slightly hyperechoic fairly homogeneous appearing region
with inter increased vascularity. No cystic component is observed
within it. This measures 1.5 x 1.2 x 2.4 cm. There is a tiny left
adnexal cystic structure without internal echoes which measures
x 1.4 x 1.2 cm. Its walls exhibit no hypervascularity.

Pulsed Doppler evaluation of both ovaries demonstrates normal
low-resistance arterial and venous waveforms.

Other findings

There is a small amount of free pelvic fluid.
IMPRESSION: Normal appearance of the uterus, endometrium, and both ovaries. The
right adnexal region is normal.

In the left adnexal region there is a simple appearing cystic
structure measuring 1.5 cm in greatest dimension that is separate
from the ovary. There is also a solid-appearing slightly
hyperechoic, hypervascular structure lateral to the ovary which
measures 1.5 x 1.2 x 2.4 cm. This is nonspecific in appearance but
could reflect a tubo-ovarian abscess or endometrioma. An ectopic
pregnancy is felt less likely but reportedly the beta HCG is
pending.

Gynecological evaluation is needed. Further imaging with CT scanning
or MRI is available upon request.

## 2019-12-29 ENCOUNTER — Ambulatory Visit: Payer: Self-pay

## 2019-12-30 ENCOUNTER — Ambulatory Visit: Payer: Self-pay

## 2020-02-20 ENCOUNTER — Ambulatory Visit: Payer: Medicaid Other

## 2020-03-12 ENCOUNTER — Ambulatory Visit (LOCAL_COMMUNITY_HEALTH_CENTER): Payer: Medicaid Other | Admitting: Advanced Practice Midwife

## 2020-03-12 ENCOUNTER — Other Ambulatory Visit: Payer: Self-pay

## 2020-03-12 VITALS — BP 126/88 | Ht 65.0 in | Wt 148.4 lb

## 2020-03-12 DIAGNOSIS — Z3009 Encounter for other general counseling and advice on contraception: Secondary | ICD-10-CM

## 2020-03-12 DIAGNOSIS — Z720 Tobacco use: Secondary | ICD-10-CM

## 2020-03-12 DIAGNOSIS — Z30013 Encounter for initial prescription of injectable contraceptive: Secondary | ICD-10-CM

## 2020-03-12 LAB — WET PREP FOR TRICH, YEAST, CLUE
Trichomonas Exam: NEGATIVE
Yeast Exam: NEGATIVE

## 2020-03-12 LAB — PREGNANCY, URINE: Preg Test, Ur: NEGATIVE

## 2020-03-12 MED ORDER — MEDROXYPROGESTERONE ACETATE 150 MG/ML IM SUSP
150.0000 mg | INTRAMUSCULAR | Status: AC
  Administered 2020-03-12: 150 mg via INTRAMUSCULAR

## 2020-03-12 NOTE — Progress Notes (Signed)
[facility] [address] Main Number: [phone]    Family Planning Visit- Initial Visit  Subjective:  Shariah Assad is a 29 y.o. SBF vaper [id number](3 mo old daughter)   being seen today for an initial well woman visit and to discuss family planning options.  She is currently using None for pregnancy prevention. Patient reports she does not want a pregnancy in the next year.  Patient has the following medical conditions has Migraine headache with aura and Sickle-cell trait (HCC) on their problem list.  Chief Complaint  Patient presents with  . Contraception    physical, depo, std screen    Patient reports LMP 02/11/20.  Last pap 2019 neg.  Last sex 02/17/20 without condom; with current partner x 3 years, 1 sex partner in last 3 mo.  Vapes.  Last ETOH 02/09/20 (3 [place])3x/mo.  Last MJ 02/09/20.  Wants DMPA.  Employed 35 hrs/wk.  C/o odor to urine x 2 wks. Patient denies cigs  Body mass index is 24.7 kg/m. - Patient is eligible for diabetes screening based on BMI and age >1?  not applicable HA1C ordered? not applicable  Patient reports 1 of partners in last year. Desires STI screening?  Yes  Has patient been screened once for HCV in the past?  No  No results found for: HCVAB  Does the patient have current drug use (including MJ), have a partner with drug use, and/or has been incarcerated since last result? Yes  If yes-- Screen for HCV through [facility]   Does the patient meet criteria for HBV testing? No  Criteria:  -Household, sexual or needle sharing contact with HBV -History of drug use -HIV positive -Those with known Hep C   Health Maintenance Due  Topic Date Due  . Hepatitis C Screening  Never done  . COVID-19 Vaccine (1) Never done  . TETANUS/TDAP  Never done  . INFLUENZA VACCINE  Never done    Review of Systems  All other systems reviewed and are negative.   The following portions of  the patient's history were reviewed and updated as appropriate: allergies, current medications, past family history, past medical history, past social history, past surgical history and problem list. Problem list updated.   See flowsheet for other program required questions.  Objective:   Vitals:   03/12/20 1530  BP: 126/88  Weight: 148 lb 6.4 oz (67.3 kg)  Height: 5\' 5"  (1.651 m)    Physical Exam Constitutional:      Appearance: Normal appearance. She is normal weight.  HENT:     Head: Normocephalic and atraumatic.     Mouth/Throat:     Mouth: Mucous membranes are moist.  Eyes:     Conjunctiva/sclera: Conjunctivae normal.  Cardiovascular:     Rate and Rhythm: Normal rate and regular rhythm.  Pulmonary:     Effort: Pulmonary effort is normal.     Breath sounds: Normal breath sounds.  Chest:     Breasts:        Right: Normal.        Left: Normal.  Abdominal:     Palpations: Abdomen is soft.     Comments: Soft without masses or tenderness, fair tone  Genitourinary:    General: Normal vulva.     Exam position: Lithotomy position.     Vagina: Vaginal discharge (grey malodorous leukorrhea, ph>4.5) present.     Cervix: Normal.     Uterus: Normal.  Adnexa: Right adnexa normal and left adnexa normal.     Rectum: Normal.  Musculoskeletal:        General: Normal range of motion.     Cervical back: Normal range of motion and neck supple.  Skin:    General: Skin is warm and dry.  Neurological:     Mental Status: She is alert.  Psychiatric:        Mood and Affect: Mood normal.       Assessment and Plan:  Nevin Kozuch is a 29 y.o. female presenting to the [facility] Department for an initial well woman exam/family planning visit  Contraception counseling: Reviewed all forms of birth control options in the tiered based approach. available including abstinence; over the counter/barrier methods; hormonal contraceptive medication including pill, patch,  ring, injection,contraceptive implant, ECP; hormonal and nonhormonal IUDs; permanent sterilization options including vasectomy and the various tubal sterilization modalities. Risks, benefits, and typical effectiveness rates were reviewed.  Questions were answered.  Written information was also given to the patient to review.  Patient desires DMPA, this was prescribed for patient. She will follow up in  11-13 wks for surveillance.  She was told to call with any further questions, or with any concerns about this method of contraception.  Emphasized use of condoms 100% of the time for STI prevention.  Patient was offered ECP. ECP was not accepted by the patient. ECP counseling was not given - see RN documentation  1. Family planning If PT neg today pt wants DMPA 150 mg IM q 11-13 wks x 1 year Please counsel on need for abstinance/back up condoms next 7 days Treat wet mount per standing orders Immunization nurse consult - Pregnancy, urine - WET PREP FOR TRICH, YEAST, CLUE - Chlamydia/Gonorrhea [facility]  2. Encounter for initial prescription of injectable contraceptive See above note     No follow-ups on file.  No future appointments.  Alberteen Spindle, CNM

## 2020-03-12 NOTE — Progress Notes (Signed)
Patient interested in physical, std screening, and possibly depo. Would like more teaching about LARCs. Denies blood work. Denies condoms.   Harvie Heck, RN   Post:  Wetmount reviewed with patient. RN dispensed metro #14 per Hazle Coca CNM order. Condoms declined. RN dispensed depo IM injection. Reminder card given for next depo due date. [facility] brochure and healthy living booklet given.   Harvie Heck, RN  Harvie Heck, RN

## 2020-04-30 ENCOUNTER — Ambulatory Visit: Payer: Medicaid Other

## 2020-05-28 ENCOUNTER — Ambulatory Visit: Payer: Medicaid Other | Admitting: Physician Assistant

## 2020-05-28 ENCOUNTER — Encounter: Payer: Self-pay | Admitting: Physician Assistant

## 2020-05-28 ENCOUNTER — Other Ambulatory Visit: Payer: Self-pay

## 2020-05-28 DIAGNOSIS — N760 Acute vaginitis: Secondary | ICD-10-CM | POA: Diagnosis not present

## 2020-05-28 DIAGNOSIS — B9689 Other specified bacterial agents as the cause of diseases classified elsewhere: Secondary | ICD-10-CM | POA: Diagnosis not present

## 2020-05-28 DIAGNOSIS — Z113 Encounter for screening for infections with a predominantly sexual mode of transmission: Secondary | ICD-10-CM

## 2020-05-28 LAB — WET PREP FOR TRICH, YEAST, CLUE
Trichomonas Exam: NEGATIVE
Yeast Exam: NEGATIVE

## 2020-05-28 MED ORDER — METRONIDAZOLE 0.75 % VA GEL: 1.0000 | Freq: Every day | VAGINAL | 0 refills | Status: AC

## 2020-05-28 NOTE — Progress Notes (Signed)
[facility] Department STI clinic/screening visit  Subjective:  Crystal Short is a 30 y.o. female being seen today for an STI screening visit. The patient reports they do have symptoms.  Patient reports that they do not desire a pregnancy in the next year.   They reported they are not interested in discussing contraception today.  No LMP recorded.   Patient has the following medical conditions:   Patient Active Problem List   Diagnosis Date Noted  . 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 Migraine headache with aura 09/21/2015  . Sickle-cell trait (HCC) 09/21/2015    Chief Complaint  Patient presents with  . SEXUALLY TRANSMITTED DISEASE    screening    HPI  Patient reports that she has had a sticky, white discharge for 1 week.  Denies other symptoms, chronic conditions and regular medicines.  Patient states last HIV test was in 2021 and last pap was in 2020.  Reports that she is using Depo as BCM and that she thinks her shot is due soon.    See flowsheet for further details and programmatic requirements.    The following portions of the patient's history were reviewed and updated as appropriate: allergies, current medications, past medical history, past social history, past surgical history and problem list.  Objective:  There were no vitals filed for this visit.  Physical Exam Constitutional:      General: She is not in acute distress.    Appearance: Normal appearance.  HENT:     Head: Normocephalic and atraumatic.     Comments: No nits,lice, or hair loss. No cervical, supraclavicular or axillary adenopathy.    Mouth/Throat:     Mouth: Mucous membranes are moist.     Pharynx: Oropharynx is clear. No oropharyngeal exudate or posterior oropharyngeal erythema.  Eyes:     Conjunctiva/sclera: Conjunctivae normal.  Pulmonary:     Effort: Pulmonary effort is normal.  Abdominal:     Palpations: Abdomen is soft. There is no mass.     Tenderness:  There is no abdominal tenderness. There is no guarding or rebound.  Genitourinary:    General: Normal vulva.     Rectum: Normal.     Comments: External genitalia/pubic area without nits, lice, edema, erythema, lesions and inguinal adenopathy. Vagina with normal mucosa and small amount of menstrual blood is present.. Cervix without visible lesions. Uterus firm, mobile, nt, no masses, no CMT, no adnexal tenderness or fullness. Musculoskeletal:     Cervical back: Neck supple. No tenderness.  Skin:    General: Skin is warm and dry.     Findings: No bruising, erythema, lesion or rash.  Neurological:     Mental Status: She is alert and oriented to person, place, and time.  Psychiatric:        Mood and Affect: Mood normal.        Behavior: Behavior normal.        Thought Content: Thought content normal.        Judgment: Judgment normal.      Assessment and Plan:  [name] is a 30 y.o. female presenting to the [facility] Department for STI screening  1. Screening for STD (sexually transmitted disease) Patient into clinic with symptoms. Patient declines blood work and Tax adviser today. Reviewed wet mount results with patient. Rec condoms with all sex. - WET PREP FOR TRICH, YEAST, CLUE  2. BV (bacterial vaginosis) Will treat for BV with Vandazole 0.75% gel 1 app qhs for 5 days.  No sex for 7 days. Enc to use OTC antifungal cream if has itching during or after using antibiotic gel. - metroNIDAZOLE (VANDAZOLE) 0.75 % vaginal gel; Place 1 Applicatorful vaginally at bedtime for 5 days.  Dispense: 70 g; Refill: 0     No follow-ups on file.  No future appointments.  Matt Holmes, PA

## 2020-05-29 NOTE — Progress Notes (Signed)
Chart reviewed by Pharmacist  Suzanne Walker PharmD, Contract Pharmacist at [facility]  

## 2020-06-10 ENCOUNTER — Ambulatory Visit: Payer: Medicaid Other

## 2020-06-24 ENCOUNTER — Ambulatory Visit: Payer: Medicaid Other

## 2020-06-28 ENCOUNTER — Ambulatory Visit: Payer: Medicaid Other

## 2020-07-02 ENCOUNTER — Ambulatory Visit: Payer: Medicaid Other

## 2020-07-07 ENCOUNTER — Ambulatory Visit: Payer: Medicaid Other

## 2020-07-14 ENCOUNTER — Ambulatory Visit: Payer: Medicaid Other

## 2020-07-27 ENCOUNTER — Ambulatory Visit: Payer: Medicaid Other

## 2020-08-02 ENCOUNTER — Ambulatory Visit: Payer: Medicaid Other

## 2020-10-05 ENCOUNTER — Other Ambulatory Visit: Payer: Self-pay

## 2020-10-05 ENCOUNTER — Ambulatory Visit: Payer: Medicaid Other | Admitting: Physician Assistant

## 2020-10-05 DIAGNOSIS — Z113 Encounter for screening for infections with a predominantly sexual mode of transmission: Secondary | ICD-10-CM

## 2020-10-05 LAB — WET PREP FOR TRICH, YEAST, CLUE
Clue Cell Exam: POSITIVE — AB
Trichomonas Exam: NEGATIVE
Yeast Exam: NEGATIVE

## 2020-10-05 NOTE — Progress Notes (Signed)
Wet mount reviewed by provider, no tx per provider order. Provider orders completed. 

## 2020-10-07 ENCOUNTER — Encounter: Payer: Self-pay | Admitting: Physician Assistant

## 2020-10-07 NOTE — Progress Notes (Signed)
[facility] STI clinic/screening visit  Subjective:  Crystal Short is a 30 y.o. female being seen today for an STI screening visit. The patient reports they do have symptoms.  Patient reports that they do not desire a pregnancy in the next year.   They reported they are not interested in discussing contraception today.  No LMP recorded.   Patient has the following medical conditions:   Patient Active Problem List   Diagnosis Date Noted  . 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 Migraine headache with aura 09/21/2015  . Sickle-cell trait (HCC) 09/21/2015    Chief Complaint  Patient presents with  . SEXUALLY TRANSMITTED DISEASE    STD screening except bloodwork    HPI  Patient reports that she had several teeth removed yesterday and is currently taking Amoxicillin to prevent infection.  States that she has had a vaginal odor for a few days and wants to make sure that she does not have BV.  Reports last HIV test was in 2021 and last pap was in 2020. States that she got a Depo in January of this year and has not had a period since some bleeding in February.  States that she uses condoms as her BCM.   See flowsheet for further details and programmatic requirements.    The following portions of the patient's history were reviewed and updated as appropriate: allergies, current medications, past medical history, past social history, past surgical history and problem list.  Objective:  There were no vitals filed for this visit.  Physical Exam Constitutional:      General: She is not in acute distress.    Appearance: Normal appearance.  HENT:     Head: Normocephalic and atraumatic.     Comments: No nits,lice, or hair loss. No cervical, supraclavicular or axillary adenopathy.    Mouth/Throat:     Mouth: Mucous membranes are moist.     Pharynx: Oropharynx is clear. No oropharyngeal exudate or posterior oropharyngeal erythema.  Eyes:      Conjunctiva/sclera: Conjunctivae normal.  Pulmonary:     Effort: Pulmonary effort is normal.  Abdominal:     Palpations: Abdomen is soft. There is no mass.     Tenderness: There is no abdominal tenderness. There is no guarding or rebound.  Genitourinary:    General: Normal vulva.     Rectum: Normal.  Musculoskeletal:     Cervical back: Neck supple. No tenderness.  Skin:    General: Skin is warm and dry.     Findings: No bruising, erythema, lesion or rash.  Neurological:     Mental Status: She is alert and oriented to person, place, and time.  Psychiatric:        Mood and Affect: Mood normal.        Behavior: Behavior normal.        Thought Content: Thought content normal.        Judgment: Judgment normal.      Assessment and Plan:  Crystal Short is a 30 y.o. female presenting to the [facility] Department for STI screening  1. Screening for STD (sexually transmitted disease) Patient into clinic with symptoms.  Reviewed wet mount results. Enc to use OTC antifungal cream if has itching during or just after antibiotic use. Rec condoms with all sex. Await test results.  Counseled that RN will call if needs to RTC for treatment once results are back. - WET PREP FOR TRICH, YEAST, CLUE     No follow-ups  on file.  No future appointments.  Jerene Dilling, PA

## 2020-11-16 ENCOUNTER — Other Ambulatory Visit: Payer: Self-pay

## 2020-11-16 ENCOUNTER — Emergency Department
Admission: EM | Admit: 2020-11-16 | Discharge: 2020-11-16 | Disposition: A | Discharge status: Home / Self Care | Payer: Medicaid Other | Attending: Emergency Medicine | Admitting: Emergency Medicine

## 2020-11-16 DIAGNOSIS — R519 Headache, unspecified: Secondary | ICD-10-CM | POA: Diagnosis present

## 2020-11-16 DIAGNOSIS — Z20822 Contact with and (suspected) exposure to covid-19: Secondary | ICD-10-CM | POA: Diagnosis not present

## 2020-11-16 DIAGNOSIS — G43819 Other migraine, intractable, without status migrainosus: Secondary | ICD-10-CM | POA: Insufficient documentation

## 2020-11-16 LAB — RESP PANEL BY RT-PCR (FLU A&B, COVID) ARPGX2
Influenza A by PCR: NEGATIVE
Influenza B by PCR: NEGATIVE
SARS Coronavirus 2 by RT PCR: NEGATIVE

## 2020-11-16 LAB — POC URINE PREG, ED: Preg Test, Ur: NEGATIVE

## 2020-11-16 MED ORDER — KETOROLAC TROMETHAMINE 30 MG/ML IJ SOLN
30.0000 mg | Freq: Once | INTRAMUSCULAR | Status: AC
  Administered 2020-11-16: 30 mg via INTRAMUSCULAR
  Filled 2020-11-16: qty 1

## 2020-11-16 MED ORDER — ONDANSETRON 4 MG PO TBDP: 4.0000 mg | ORAL_TABLET | Freq: Three times a day (TID) | ORAL | 0 refills | Status: DC | PRN

## 2020-11-16 MED ORDER — ACETAMINOPHEN 325 MG PO TABS
650.0000 mg | ORAL_TABLET | Freq: Once | ORAL | Status: AC
  Administered 2020-11-16: 650 mg via ORAL
  Filled 2020-11-16: qty 2

## 2020-11-16 MED ORDER — ONDANSETRON 4 MG PO TBDP
4.0000 mg | ORAL_TABLET | Freq: Once | ORAL | Status: AC
  Administered 2020-11-16: 4 mg via ORAL
  Filled 2020-11-16: qty 1

## 2020-11-16 NOTE — ED Provider Notes (Signed)
[facility] Emergency Department Provider Note  ____________________________________________   Event Date/Time   First MD Initiated Contact with Patient 11/16/20 1103     (approximate)  I have reviewed the triage vital signs and the nursing notes.   HISTORY  Chief Complaint Nausea and Headache    HPI Crystal Short is a 30 y.o. female with history of headaches who comes in with concern for headache.  Patient reports that she has been having a headache for the past 2 days been taking BC powder without relief in symptoms.  She states that she is been feeling nauseous and not able to drink as much is normal.  She states that her headaches move all around, worse with light, better in the dark.  Denies any vision changes.  She does report being around her brother who was recently diagnosed with COVID.          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 Authorizing Provider  amoxicillin (AMOXIL) 500 MG capsule Take 500 mg by mouth 3 (three) times daily. 10/04/20   [provider]  HYDROcodone-acetaminophen (NORCO/VICODIN) 5-325 MG tablet Take by mouth. 10/04/20   [provider]  hydrOXYzine (ATARAX/VISTARIL) 50 MG tablet Take 1 tablet (50 mg total) by mouth 3 (three) times daily as needed for itching. Patient not taking: Reported on 03/12/2020 11/19/18   Joni Reining, PA-C  ibuprofen (ADVIL) 600 MG tablet Take 600 mg by mouth 3 (three) times daily. 10/04/20   [provider]  sulfamethoxazole-trimethoprim (BACTRIM DS) 800-160 MG  tablet Take 1 tablet by mouth 2 (two) times daily. Patient not taking: Reported on 03/12/2020 11/19/18   Joni Reining, PA-C    Allergies Orange fruit [citrus]  Family History  Problem Relation Age of Onset   Diabetes Mother    Lung disease Mother    Lung cancer Mother    Diabetes Brother    Diabetes Maternal Grandmother    Sickle cell trait Father    Sickle cell trait Paternal Grandmother    Congestive Heart Failure Paternal Grandfather    Sickle cell anemia Half-Sister     Social History Social History   Tobacco Use   Smoking status: Never   Smokeless tobacco: Never  Vaping Use   Vaping Use: Every day   Start date: 12/02/2019  Substance Use Topics   Alcohol use: Yes    Alcohol/week: 3.0 standard drinks    Types: 3 Standard drinks or equivalent per week    Comment: 3x/mo   Drug use: Yes    Types: Marijuana    Comment: last use 02/09/20      Review of Systems Constitutional: No fever/chills Eyes: No visual changes. ENT: No sore throat. Cardiovascular: Denies chest pain. Respiratory: Denies shortness of breath. Gastrointestinal: No abdominal pain.  Positive nausea Genitourinary: Negative for dysuria. Musculoskeletal: Negative for back pain. Skin: Negative for rash. Neurological: Positive headache, no focal weakness or numbness. All other ROS negative ____________________________________________   PHYSICAL EXAM:  VITAL SIGNS: ED Triage Vitals  Enc Vitals Group  BP 11/16/20 1049 117/74     Pulse Rate 11/16/20 1049 (!) 53     Resp 11/16/20 1049 18     Temp 11/16/20 1049 98.6 F (37 C)     Temp Source 11/16/20 1049 Oral     SpO2 11/16/20 1049 100 %     Weight --      Height --      Head Circumference --      Peak Flow --      Pain Score 11/16/20 1047 10     Pain Loc --      Pain Edu? --      Excl. in GC? --     Constitutional: Alert and oriented. Well appearing and in no acute distress. Eyes: Conjunctivae are normal. EOMI. pupils are reactive  bilaterally Head: Atraumatic. Nose: No congestion/rhinnorhea. Mouth/Throat: Mucous membranes are moist.   Neck: No stridor. Trachea Midline. FROM Cardiovascular: Normal rate, regular rhythm. Grossly normal heart sounds.  Good peripheral circulation. Respiratory: Normal respiratory effort.  No retractions. Lungs CTAB. Gastrointestinal: Soft and nontender. No distention. No abdominal bruits.  Musculoskeletal: No lower extremity tenderness nor edema.  No joint effusions. Neurologic:  Normal speech and language. No gross focal neurologic deficits are appreciated.  Cranial nerves II to XII are intact.  Equal strength in arms and legs. Skin:  Skin is warm, dry and intact. No rash noted. Psychiatric: Mood and affect are normal. Speech and behavior are normal. GU: Deferred   ____________________________________________   LABS (all labs ordered are listed, but only abnormal results are displayed)  Labs Reviewed  RESP PANEL BY RT-PCR (FLU A&B, COVID) ARPGX2  POC URINE PREG, ED   ____________________________________________   PROCEDURES  Procedure(s) performed (including Critical Care):  Procedures   ____________________________________________   INITIAL IMPRESSION / ASSESSMENT AND PLAN / ED COURSE  Lincy Belles was evaluated in Emergency Department on 11/16/2020 for the symptoms described in the history of present illness. She was evaluated in the context of the global COVID-19 pandemic, which necessitated consideration that the patient might be at risk for infection with the SARS-CoV-2 virus that causes COVID-19. Institutional protocols and algorithms that pertain to the evaluation of patients at risk for COVID-19 are in a state of rapid change based on information released by regulatory bodies including the CDC and federal and state organizations. These policies and algorithms were followed during the patient's care in the ED.    Patient is a very well-appearing 30 year old who  comes in with headache, nausea.  Suspect this is most likely migraine given prior history we will also get COVID swab to make sure not COVID or flu.  Pregnancy test was negative.  We will treat with Toradol, Tylenol, Zofran. No fevers, well-appearing, supple neck therefore unlikely meningitis, [place] spotted fever.  No vision changes suggest CVT. Neuro intact.  1:30 PM after medications patient is feeling much better.  Patient states that her headache is now resolved.  Recommended Tylenol and ibuprofen to help with pain given some Zofran for nausea at home.  Discussed return precautions       ____________________________________________   FINAL CLINICAL IMPRESSION(S) / ED DIAGNOSES   Final diagnoses:  Other migraine without status migrainosus, intractable      MEDICATIONS GIVEN DURING THIS VISIT:  Medications  ondansetron (ZOFRAN-ODT) disintegrating tablet 4 mg (4 mg Oral Given 11/16/20 1154)  acetaminophen (TYLENOL) tablet 650 mg (650 mg Oral Given 11/16/20 1154)  ketorolac (TORADOL) 30 MG/ML injection 30 mg (30 mg Intramuscular Given  11/16/20 1155)     ED Discharge Orders          Ordered    ondansetron (ZOFRAN ODT) 4 MG disintegrating tablet  Every 8 hours PRN        11/16/20 1330             Note:  This document was prepared using Dragon voice recognition software and may include unintentional dictation errors.    Concha Se, MD 11/16/20 1331

## 2020-11-16 NOTE — Discharge Instructions (Addendum)
Take Tylenol 1 g every 8 hours to help with pain.  She can take the [facility] powder  but it has aspirin in it and is very similar to ibuprofen so do not take all of these together.  Choose one to use. Do not use more then directed on bottle.  I prescribed some Zofran to help with nausea.  Return to the ER if your symptoms are worsening or you have any other concerns.

## 2020-11-16 NOTE — ED Notes (Signed)
Gave pt urine cup. 

## 2020-11-16 NOTE — ED Triage Notes (Signed)
Pt comes with c/o headache and nausea. Pt states covid exposure.

## 2020-11-22 ENCOUNTER — Other Ambulatory Visit: Payer: Self-pay

## 2020-11-22 ENCOUNTER — Encounter: Payer: Self-pay | Admitting: Advanced Practice Midwife

## 2020-11-22 ENCOUNTER — Ambulatory Visit: Payer: Medicaid Other | Admitting: Advanced Practice Midwife

## 2020-11-22 DIAGNOSIS — B9689 Other specified bacterial agents as the cause of diseases classified elsewhere: Secondary | ICD-10-CM

## 2020-11-22 DIAGNOSIS — F1290 Cannabis use, unspecified, uncomplicated: Secondary | ICD-10-CM | POA: Insufficient documentation

## 2020-11-22 DIAGNOSIS — Z113 Encounter for screening for infections with a predominantly sexual mode of transmission: Secondary | ICD-10-CM

## 2020-11-22 DIAGNOSIS — N760 Acute vaginitis: Secondary | ICD-10-CM

## 2020-11-22 MED ORDER — METRONIDAZOLE 500 MG PO TABS: 500.0000 mg | ORAL_TABLET | Freq: Two times a day (BID) | ORAL | 0 refills | Status: AC

## 2020-11-22 NOTE — Progress Notes (Addendum)
Pt here for STD screening.  Wet mount results reviewed.  Medication dispensed per SO.  Pt declined condoms. Berdie Ogren, RN

## 2020-11-22 NOTE — Progress Notes (Signed)
[facility] Department STI clinic/screening visit  Subjective:  Crystal Short is a 30 y.o. SBF G2P1 vaper female being seen today for an STI screening visit. The patient reports they do not have symptoms.  Patient reports that they do not desire a pregnancy in the next year.   They reported they are not interested in discussing contraception today.  Patient's last menstrual period was 11/07/2020 (exact date).   Patient has the following medical conditions:   Patient Active Problem List   Diagnosis Date Noted   Marijuana use 11/22/2020   Vapes nicotine containing substance 03/12/2020   Migraine headache with aura 09/21/2015   Sickle-cell trait (HCC) 09/21/2015    Chief Complaint  Patient presents with   SEXUALLY TRANSMITTED DISEASE    Screening     HPI  Patient reports last sex last night without condom; with current partner x 4 mo; 3 sex partners in last 3 mo. Last MJ yesterday. Last ETOH 11/20/20 (5 Coronas) 4-5x/mo. LMP 11/07/20. Vaping. Last cigar are 18.  Last HIV test per patient/review of record was 08/26/19 Patient reports last pap was 05/20/2018 neg  See flowsheet for further details and programmatic requirements.    The following portions of the patient's history were reviewed and updated as appropriate: allergies, current medications, past medical history, past social history, past surgical history and problem list.  Objective:  There were no vitals filed for this visit.  Physical Exam Vitals and nursing note reviewed.  Constitutional:      Appearance: Normal appearance. She is normal weight.  HENT:     Head: Normocephalic and atraumatic.     Mouth/Throat:     Mouth: Mucous membranes are moist.     Pharynx: Oropharynx is clear. No oropharyngeal exudate or posterior oropharyngeal erythema.  Eyes:     Conjunctiva/sclera: Conjunctivae normal.  Neck:     Thyroid: No thyroid mass, thyromegaly or thyroid tenderness.  Pulmonary:     Effort: Pulmonary  effort is normal.  Chest:  Breasts:    Right: No axillary adenopathy or supraclavicular adenopathy.     Left: No axillary adenopathy or supraclavicular adenopathy.  Abdominal:     General: Abdomen is flat.     Palpations: Abdomen is soft. There is no mass.     Tenderness: There is no abdominal tenderness. There is no rebound.     Comments: Soft without masses or tenderness, good tone  Genitourinary:    General: Normal vulva.     Exam position: Lithotomy position.     Pubic Area: No rash or pubic lice.      Labia:        Right: No rash or lesion.        Left: No rash or lesion.      Vagina: Vaginal discharge (white sl malodorous leukorrhea, ph >4.5) present. No erythema, bleeding or lesions.     Cervix: Normal.     Uterus: Normal.      Adnexa: Right adnexa normal and left adnexa normal.     Rectum: Normal.  Lymphadenopathy:     Head:     Right side of head: No preauricular or posterior auricular adenopathy.     Left side of head: No preauricular or posterior auricular adenopathy.     Cervical: No cervical adenopathy.     Upper Body:     Right upper body: No supraclavicular or axillary adenopathy.     Left upper body: No supraclavicular or axillary adenopathy.     Lower Body:  No right inguinal adenopathy. No left inguinal adenopathy.  Skin:    General: Skin is warm and dry.     Findings: No rash.  Neurological:     Mental Status: She is alert and oriented to person, place, and time.     Assessment and Plan:  Crystal Short is a 30 y.o. female presenting to the [facility] Department for STI screening  1. Screening examination for venereal disease Treat wet mount per standing orders Immunization nurse consult - WET PREP FOR TRICH, YEAST, CLUE - Chlamydia/Gonorrhea [facility]  2. Marijuana use Last use yesterday     No follow-ups on file.  No future appointments.  Alberteen Spindle, CNM

## 2020-11-23 LAB — WET PREP FOR TRICH, YEAST, CLUE
Trichomonas Exam: NEGATIVE
Yeast Exam: NEGATIVE

## 2020-12-18 ENCOUNTER — Emergency Department (HOSPITAL_COMMUNITY)
Admission: EM | Admit: 2020-12-18 | Discharge: 2020-12-19 | Disposition: A | Discharge status: Home / Self Care | Payer: Medicaid Other | Attending: Emergency Medicine | Admitting: Emergency Medicine

## 2020-12-18 ENCOUNTER — Emergency Department
Admission: EM | Admit: 2020-12-18 | Discharge: 2020-12-18 | Disposition: A | Discharge status: Home / Self Care | Payer: Medicaid Other | Attending: Emergency Medicine | Admitting: Emergency Medicine

## 2020-12-18 ENCOUNTER — Other Ambulatory Visit: Payer: Self-pay

## 2020-12-18 DIAGNOSIS — R109 Unspecified abdominal pain: Secondary | ICD-10-CM | POA: Diagnosis not present

## 2020-12-18 DIAGNOSIS — Z28310 Unvaccinated for covid-19: Secondary | ICD-10-CM | POA: Diagnosis not present

## 2020-12-18 DIAGNOSIS — R509 Fever, unspecified: Secondary | ICD-10-CM | POA: Insufficient documentation

## 2020-12-18 DIAGNOSIS — M549 Dorsalgia, unspecified: Secondary | ICD-10-CM | POA: Diagnosis not present

## 2020-12-18 DIAGNOSIS — Z20822 Contact with and (suspected) exposure to covid-19: Secondary | ICD-10-CM | POA: Diagnosis not present

## 2020-12-18 DIAGNOSIS — M79652 Pain in left thigh: Secondary | ICD-10-CM | POA: Insufficient documentation

## 2020-12-18 DIAGNOSIS — M791 Myalgia, unspecified site: Secondary | ICD-10-CM | POA: Diagnosis not present

## 2020-12-18 DIAGNOSIS — R102 Pelvic and perineal pain: Secondary | ICD-10-CM | POA: Insufficient documentation

## 2020-12-18 DIAGNOSIS — Z5321 Procedure and treatment not carried out due to patient leaving prior to being seen by health care provider: Secondary | ICD-10-CM | POA: Insufficient documentation

## 2020-12-18 DIAGNOSIS — R110 Nausea: Secondary | ICD-10-CM | POA: Diagnosis present

## 2020-12-18 DIAGNOSIS — R300 Dysuria: Secondary | ICD-10-CM | POA: Insufficient documentation

## 2020-12-18 DIAGNOSIS — M79651 Pain in right thigh: Secondary | ICD-10-CM | POA: Insufficient documentation

## 2020-12-18 DIAGNOSIS — R519 Headache, unspecified: Secondary | ICD-10-CM | POA: Diagnosis not present

## 2020-12-18 LAB — URINALYSIS, ROUTINE W REFLEX MICROSCOPIC
Bilirubin Urine: NEGATIVE
Glucose, UA: NEGATIVE mg/dL
Ketones, ur: 80 mg/dL — AB
Nitrite: POSITIVE — AB
Protein, ur: 100 mg/dL — AB
RBC / HPF: >50 RBC/hpf — ABNORMAL HIGH (ref 0–5)
Specific Gravity, Urine: 1.011 (ref 1.005–1.030)
WBC, UA: >50 WBC/hpf — ABNORMAL HIGH (ref 0–5)
pH: 6 (ref 5.0–8.0)

## 2020-12-18 LAB — BASIC METABOLIC PANEL
Anion gap: 11 (ref 5–15)
BUN: 12 mg/dL (ref 6–20)
CO2: 22 mmol/L (ref 22–32)
Calcium: 9.9 mg/dL (ref 8.9–10.3)
Chloride: 101 mmol/L (ref 98–111)
Creatinine, Ser: 1.03 mg/dL — ABNORMAL HIGH (ref 0.44–1.00)
GFR, Estimated: >60 mL/min (ref >60)
Glucose, Bld: 78 mg/dL (ref 70–99)
Potassium: 3.5 mmol/L (ref 3.5–5.1)
Sodium: 134 mmol/L — ABNORMAL LOW (ref 135–145)

## 2020-12-18 LAB — CBC WITH DIFFERENTIAL/PLATELET
Abs Immature Granulocytes: 0.07 10*3/uL (ref 0.00–0.07)
Basophils Absolute: 0.1 10*3/uL (ref 0.0–0.1)
Basophils Relative: 0 %
Eosinophils Absolute: 0 10*3/uL (ref 0.0–0.5)
Eosinophils Relative: 0 %
HCT: 38.1 % (ref 36.0–46.0)
Hemoglobin: 12.7 g/dL (ref 12.0–15.0)
Immature Granulocytes: 1 %
Lymphocytes Relative: 7 %
Lymphs Abs: 1.1 10*3/uL (ref 0.7–4.0)
MCH: 26.5 pg (ref 26.0–34.0)
MCHC: 33.3 g/dL (ref 30.0–36.0)
MCV: 79.5 fL — ABNORMAL LOW (ref 80.0–100.0)
Monocytes Absolute: 1 10*3/uL (ref 0.1–1.0)
Monocytes Relative: 7 %
Neutro Abs: 12 10*3/uL — ABNORMAL HIGH (ref 1.7–7.7)
Neutrophils Relative %: 85 %
Platelets: 338 10*3/uL (ref 150–400)
RBC: 4.79 MIL/uL (ref 3.87–5.11)
RDW: 14.3 % (ref 11.5–15.5)
WBC: 14.3 10*3/uL — ABNORMAL HIGH (ref 4.0–10.5)
nRBC: 0 % (ref 0.0–0.2)

## 2020-12-18 LAB — I-STAT BETA HCG BLOOD, ED (MC, WL, AP ONLY): I-stat hCG, quantitative: <5 m[IU]/mL (ref <5)

## 2020-12-18 LAB — RESP PANEL BY RT-PCR (FLU A&B, COVID) ARPGX2
Influenza A by PCR: NEGATIVE
Influenza B by PCR: NEGATIVE
SARS Coronavirus 2 by RT PCR: NEGATIVE

## 2020-12-18 NOTE — ED Notes (Signed)
Called for vitals, no response x2

## 2020-12-18 NOTE — ED Notes (Signed)
Pt to first nurse desk asked "whats taking so long?"- explained to pt that there were pt's who have been waiting longer than her- pt states "y'all slow as fuck for no damn reason" and left

## 2020-12-18 NOTE — ED Triage Notes (Signed)
Pt via POV from home. Pt c/o nausea, headache, body aches, and fever since Thursday. Pt states she had a temp of 102 yesterday. Denies use of Tylenol or Ibupofen today. Pt is A&Ox4 and NAD.

## 2020-12-18 NOTE — ED Provider Notes (Signed)
Emergency Medicine Provider Triage Evaluation Note  Crystal Short , a 30 y.o. female  was evaluated in triage.  Pt complains of back pain.  Patient states that she had a fever yesterday.  She woke up this morning with pain in her back and abdomen.  She states that it feels like when she had an epidural last year.  She states that she has pain from the bottom of her rib cage to the top of her knees.  She also has associated burning with urination.  She states that she also feels numbness in her abdomen.  She is not vaccinated for coronavirus.  Review of Systems  Positive: Dysuria Negative: Vomiting  Physical Exam  BP (!) 114/92 (BP Location: Right Arm)   Pulse 97   Temp 99.3 F (37.4 C) (Oral)   Resp 18   LMP 12/06/2020 (Approximate)   SpO2 100%  Gen:   Awake, no distress   Resp:  Normal effort  MSK:   Moves extremities without difficulty  Other:  BL CVA tenderness   Medical Decision Making  Medically screening exam initiated at 7:44 PM.  Appropriate orders placed.  Crystal Short was informed that the remainder of the evaluation will be completed by another provider, this initial triage assessment does not replace that evaluation, and the importance of remaining in the ED until their evaluation is complete.  Doubt epidural abscess- suspect uti or pyelo Labs ordered   Arthor Captain, PA-C 12/18/20 1948    Ernie Avena, MD 12/18/20 2140

## 2020-12-18 NOTE — ED Triage Notes (Signed)
Pt reported to ED with c/o pain to lower back that radiates into lower abdomen. Endorses discomfort with urination and urinary frequency.

## 2021-02-07 ENCOUNTER — Other Ambulatory Visit: Payer: Self-pay

## 2021-02-07 ENCOUNTER — Encounter: Payer: Self-pay | Admitting: Family Medicine

## 2021-02-07 ENCOUNTER — Ambulatory Visit: Payer: Medicaid Other | Admitting: Family Medicine

## 2021-02-07 DIAGNOSIS — B9689 Other specified bacterial agents as the cause of diseases classified elsewhere: Secondary | ICD-10-CM

## 2021-02-07 DIAGNOSIS — Z113 Encounter for screening for infections with a predominantly sexual mode of transmission: Secondary | ICD-10-CM

## 2021-02-07 DIAGNOSIS — N760 Acute vaginitis: Secondary | ICD-10-CM

## 2021-02-07 LAB — WET PREP FOR TRICH, YEAST, CLUE
Trichomonas Exam: NEGATIVE
Yeast Exam: NEGATIVE

## 2021-02-07 MED ORDER — METRONIDAZOLE 500 MG PO TABS: 500.0000 mg | ORAL_TABLET | Freq: Two times a day (BID) | ORAL | 0 refills | Status: AC

## 2021-02-07 NOTE — Progress Notes (Signed)
Patient here for STD testing. Wet mount reviewed per standing orders, tx per standing orders. No further questions or concerns.

## 2021-02-07 NOTE — Progress Notes (Signed)
[facility] Department STI clinic/screening visit  Subjective:  Crystal Short is a 30 y.o. female being seen today for an STI screening visit. The patient reports they  do have symptoms.  Patient reports that they do not desire a pregnancy in the next year.   They reported they are not interested in discussing contraception today.  Patient's last menstrual period was 01/07/2021 (approximate).   Patient has the following medical conditions:   Patient Active Problem List   Diagnosis Date Noted   Marijuana use 11/22/2020   Vapes nicotine containing substance 03/12/2020   Migraine headache with aura 09/21/2015   Sickle-cell trait (HCC) 09/21/2015    Chief Complaint  Patient presents with   SEXUALLY TRANSMITTED DISEASE    HPI  Patient reports here for screening, has s/sx x 3-4 days   Last HIV test per patient/review of record was 12/20/2020 Patient reports last pap was 05/20/2018.   See flowsheet for further details and programmatic requirements.    The following portions of the patient's history were reviewed and updated as appropriate: allergies, current medications, past medical history, past social history, past surgical history and problem list.  Objective:  There were no vitals filed for this visit.  Physical Exam Vitals and nursing note reviewed.  Constitutional:      Appearance: Normal appearance.  HENT:     Head: Normocephalic and atraumatic.     Mouth/Throat:     Mouth: Mucous membranes are moist.     Pharynx: Oropharynx is clear. No oropharyngeal exudate or posterior oropharyngeal erythema.  Pulmonary:     Effort: Pulmonary effort is normal.  Abdominal:     General: Abdomen is flat.     Palpations: There is no mass.     Tenderness: There is no abdominal tenderness. There is no rebound.  Genitourinary:    General: Normal vulva.     Exam position: Lithotomy position.     Pubic Area: No rash or pubic lice.      Labia:        Right: No rash  or lesion.        Left: No rash or lesion.      Vagina: Normal. No vaginal discharge, erythema, bleeding or lesions.     Cervix: No cervical motion tenderness, discharge, friability, lesion or erythema.     Uterus: Normal.      Adnexa: Right adnexa normal and left adnexa normal.     Rectum: Normal.     Comments: External genitalia without, lice, nits, erythema, edema , lesions or inguinal adenopathy. Vagina with normal mucosa and  thick white odorous, discharge and pH < 4.  Cervix without visual lesions, uterus firm, mobile, non-tender, no masses, CMT adnexal fullness or tenderness.   Lymphadenopathy:     Head:     Right side of head: No preauricular or posterior auricular adenopathy.     Left side of head: No preauricular or posterior auricular adenopathy.     Cervical: No cervical adenopathy.     Upper Body:     Right upper body: No supraclavicular or axillary adenopathy.     Left upper body: No supraclavicular or axillary adenopathy.     Lower Body: No right inguinal adenopathy. No left inguinal adenopathy.  Skin:    General: Skin is warm and dry.     Findings: No rash.  Neurological:     Mental Status: She is alert and oriented to person, place, and time.     Assessment and Plan:  Alisen Marsiglia is a 30 y.o. female presenting to the [facility] Department for STI screening  1. Screening examination for venereal disease Patient accepted all screenings including wet prep, oral, vaginal CT/GC and bloodwork for HIV/RPR.  Patient meets criteria for HepB screening? Yes. Ordered? No - declines  Patient meets criteria for HepC screening? Yes. Ordered? No - declined   - Chlamydia/Gonorrhea [facility] - HIV [facility] - WET PREP FOR TRICH, YEAST, CLUE - Syphilis Serology, [facility]  2. Bacterial vaginosis  Wet prep results +amine, + clue   Treatment needed for BV  Discussed time line for [facility] results and that patient will be called with positive results  and encouraged patient to call if she had not heard in 2 weeks.  Counseled to return or seek care for continued or worsening symptoms Recommended condom use with all sex  Patient is currently using No BCM to prevent pregnancy.    - metroNIDAZOLE (FLAGYL) 500 MG tablet; Take 1 tablet (500 mg total) by mouth 2 (two) times daily for 7 days.  Dispense: 14 tablet; Refill: 0     No follow-ups on file.  No future appointments.  Wendi Snipes, FNP

## 2021-02-11 ENCOUNTER — Encounter: Payer: Self-pay | Admitting: Physician Assistant

## 2021-02-11 DIAGNOSIS — A539 Syphilis, unspecified: Secondary | ICD-10-CM | POA: Insufficient documentation

## 2021-05-03 ENCOUNTER — Ambulatory Visit: Payer: Medicaid Other

## 2021-05-26 ENCOUNTER — Ambulatory Visit: Payer: Medicaid Other

## 2021-07-21 ENCOUNTER — Ambulatory Visit: Payer: Medicaid Other

## 2021-08-10 ENCOUNTER — Ambulatory Visit: Payer: Medicaid Other | Admitting: Family Medicine

## 2021-08-10 ENCOUNTER — Encounter: Payer: Self-pay | Admitting: Family Medicine

## 2021-08-10 DIAGNOSIS — Z113 Encounter for screening for infections with a predominantly sexual mode of transmission: Secondary | ICD-10-CM

## 2021-08-10 DIAGNOSIS — B379 Candidiasis, unspecified: Secondary | ICD-10-CM

## 2021-08-10 LAB — HM HIV SCREENING LAB: HM HIV Screening: NEGATIVE

## 2021-08-10 LAB — WET PREP FOR TRICH, YEAST, CLUE: Trichomonas Exam: NEGATIVE

## 2021-08-10 MED ORDER — CLOTRIMAZOLE 1 % VA CREA: 1.0000 | TOPICAL_CREAM | Freq: Every day | VAGINAL | 0 refills | Status: AC

## 2021-08-10 NOTE — Progress Notes (Signed)
[facility] Department ? ?STI clinic/screening visit ?[address] ?[place] [address] ?[phone] ? ?Subjective:  ?Crystal Short is a 31 y.o. female being seen today for an STI screening visit. The patient reports they do have symptoms.  Patient reports that they do not desire a pregnancy in the next year.   They reported they are not interested in discussing contraception today.   ? ?Patient's last menstrual period was 07/20/2021 (exact date). ? ? ?Patient has the following medical conditions:   ?Patient Active Problem List  ? Diagnosis Date Noted  ? Syphilis 02/11/2021  ? Marijuana use 11/22/2020  ? 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 Migraine headache with aura 09/21/2015  ? Sickle-cell trait (HCC) 09/21/2015  ? ? ?Chief Complaint  ?Patient presents with  ? SEXUALLY TRANSMITTED DISEASE  ?  Screening  ? ? ?HPI ? ?Patient reports here for screening ? ?Last HIV test per patient/review of record was 02/07/2021 ?Patient reports last pap was 05/2018.  ? ?Screening for MPX risk: ?Does the patient have an unexplained rash? No ?Is the patient MSM? No ?Does the patient endorse multiple sex partners or anonymous sex partners? No ?Did the patient have close or sexual contact with a person diagnosed with MPX? No ?Has the patient traveled outside the [place] where MPX is endemic? No ?Is there a high clinical suspicion for MPX-- evidenced by one of the following No ? -Unlikely to be chickenpox ? -Lymphadenopathy ? -Rash that present in same phase of evolution on any given body part ?See flowsheet for further details and programmatic requirements.  ? ? ?The following portions of the patient's history were reviewed and updated as appropriate: allergies, current medications, past medical history, past social history, past surgical history and problem list. ? ?Objective:  ?There were no vitals filed for this visit. ? ?Physical Exam ?Vitals and nursing note reviewed.  ?Constitutional:   ?    Appearance: Normal appearance.  ?HENT:  ?   Head: Normocephalic and atraumatic.  ?   Mouth/Throat:  ?   Mouth: Mucous membranes are moist.  ?   Pharynx: Oropharynx is clear. No oropharyngeal exudate or posterior oropharyngeal erythema.  ?Pulmonary:  ?   Effort: Pulmonary effort is normal.  ?Abdominal:  ?   General: Abdomen is flat.  ?   Palpations: There is no mass.  ?   Tenderness: There is no abdominal tenderness. There is no rebound.  ?Genitourinary: ?   General: Normal vulva.  ?   Exam position: Lithotomy position.  ?   Pubic Area: No rash or pubic lice.   ?   Labia:     ?   Right: No rash or lesion.     ?   Left: No rash or lesion.   ?   Vagina: Normal. No vaginal discharge, erythema, bleeding or lesions.  ?   Cervix: No cervical motion tenderness, discharge, friability, lesion or erythema.  ?   Uterus: Normal.   ?   Adnexa: Right adnexa normal and left adnexa normal.  ?   Rectum: Normal.  ?   Comments: External genitalia without, lice, nits, erythema, edema , lesions or inguinal adenopathy. Vagina with normal mucosa and white discharge and pH equals 4.  Cervix without visual lesions, uterus firm, mobile, non-tender, no masses, CMT adnexal fullness or tenderness.  ? ?Lymphadenopathy:  ?   Head:  ?   Right side of head: No preauricular or posterior auricular adenopathy.  ?   Left side of  head: No preauricular or posterior auricular adenopathy.  ?   Cervical: No cervical adenopathy.  ?   Upper Body:  ?   Right upper body: No supraclavicular or axillary adenopathy.  ?   Left upper body: No supraclavicular or axillary adenopathy.  ?   Lower Body: No right inguinal adenopathy. No left inguinal adenopathy.  ?Skin: ?   General: Skin is warm and dry.  ?   Findings: No rash.  ?Neurological:  ?   Mental Status: She is alert and oriented to person, place, and time.  ?Psychiatric:     ?   Mood and Affect: Mood normal.     ?   Behavior: Behavior normal.  ? ? ? ?Assessment and Plan:  ?Crystal Short is a 31 y.o. female  presenting to the [facility] Department for STI screening ? ?1. Screening examination for venereal disease ?Patient accepted all screenings including wet prep, oral, vaginal CT/GC and bloodwork for HIV/RPR.  ?Patient meets criteria for HepB screening? Yes. Ordered? No - declined  ?Patient meets criteria for HepC screening? Yes. Ordered? No - declined  ? ?Wet prep results- moderate yeast    ?Treatment needed  ?Discussed time line for [facility] results and that patient will be called with positive results and encouraged patient to call if she had not heard in 2 weeks.  ?Counseled to return or seek care for continued or worsening symptoms ?Recommended condom use with all sex ? ?Patient is currently using  NO BCM   to prevent pregnancy.  ?- Chlamydia/Gonorrhea [facility] ?- HIV [facility] ?- WET PREP FOR TRICH, YEAST, CLUE ?- Syphilis Serology, [facility] ? ? ?2. Yeast detected ? ?- clotrimazole (GYNE-LOTRIMIN) 1 % vaginal cream; Place 1 Applicatorful vaginally at bedtime for 7 days.  Dispense: 45 g; Refill: 0 ? ? ? ? ?Return for as needed. ? ?No future appointments. ? ?Wendi Snipes, FNP ?

## 2021-08-10 NOTE — Progress Notes (Signed)
Pt here for STD screening.  Wet mount results reviewed and medication dispensed per SO.  Pt declined condoms. Derris Millan M Havah Ammon, RN  

## 2021-08-17 ENCOUNTER — Ambulatory Visit: Payer: Medicaid Other

## 2021-11-21 ENCOUNTER — Encounter (HOSPITAL_COMMUNITY): Payer: Self-pay

## 2021-11-21 ENCOUNTER — Other Ambulatory Visit: Payer: Self-pay

## 2021-11-21 ENCOUNTER — Emergency Department (HOSPITAL_COMMUNITY)
Admission: EM | Admit: 2021-11-21 | Discharge: 2021-11-22 | Discharge status: Against Medical Advice | Payer: Medicaid Other | Attending: Physician Assistant | Admitting: Physician Assistant

## 2021-11-21 DIAGNOSIS — Z5321 Procedure and treatment not carried out due to patient leaving prior to being seen by health care provider: Secondary | ICD-10-CM | POA: Insufficient documentation

## 2021-11-21 DIAGNOSIS — M545 Low back pain, unspecified: Secondary | ICD-10-CM | POA: Insufficient documentation

## 2021-11-21 DIAGNOSIS — K921 Melena: Secondary | ICD-10-CM | POA: Insufficient documentation

## 2021-11-21 DIAGNOSIS — R109 Unspecified abdominal pain: Secondary | ICD-10-CM | POA: Diagnosis present

## 2021-11-21 DIAGNOSIS — R300 Dysuria: Secondary | ICD-10-CM | POA: Diagnosis not present

## 2021-11-21 LAB — COMPREHENSIVE METABOLIC PANEL
ALT: 9 U/L (ref 0–44)
AST: 14 U/L — ABNORMAL LOW (ref 15–41)
Albumin: 4 g/dL (ref 3.5–5.0)
Alkaline Phosphatase: 60 U/L (ref 38–126)
Anion gap: 8 (ref 5–15)
BUN: 10 mg/dL (ref 6–20)
CO2: 22 mmol/L (ref 22–32)
Calcium: 9.4 mg/dL (ref 8.9–10.3)
Chloride: 109 mmol/L (ref 98–111)
Creatinine, Ser: 0.9 mg/dL (ref 0.44–1.00)
GFR, Estimated: >60 mL/min (ref >60)
Glucose, Bld: 92 mg/dL (ref 70–99)
Potassium: 3.7 mmol/L (ref 3.5–5.1)
Sodium: 139 mmol/L (ref 135–145)
Total Bilirubin: 0.7 mg/dL (ref 0.3–1.2)
Total Protein: 7.2 g/dL (ref 6.5–8.1)

## 2021-11-21 LAB — URINALYSIS, MICROSCOPIC (REFLEX): RBC / HPF: >50 RBC/hpf (ref 0–5)

## 2021-11-21 LAB — PREGNANCY, URINE: Preg Test, Ur: NEGATIVE

## 2021-11-21 LAB — CBC WITH DIFFERENTIAL/PLATELET
Abs Immature Granulocytes: 0.04 10*3/uL (ref 0.00–0.07)
Basophils Absolute: 0.1 10*3/uL (ref 0.0–0.1)
Basophils Relative: 0 %
Eosinophils Absolute: 0 10*3/uL (ref 0.0–0.5)
Eosinophils Relative: 0 %
HCT: 36.1 % (ref 36.0–46.0)
Hemoglobin: 12.1 g/dL (ref 12.0–15.0)
Immature Granulocytes: 0 %
Lymphocytes Relative: 8 %
Lymphs Abs: 1.3 10*3/uL (ref 0.7–4.0)
MCH: 26.4 pg (ref 26.0–34.0)
MCHC: 33.5 g/dL (ref 30.0–36.0)
MCV: 78.6 fL — ABNORMAL LOW (ref 80.0–100.0)
Monocytes Absolute: 1.3 10*3/uL — ABNORMAL HIGH (ref 0.1–1.0)
Monocytes Relative: 8 %
Neutro Abs: 13.2 10*3/uL — ABNORMAL HIGH (ref 1.7–7.7)
Neutrophils Relative %: 84 %
Platelets: 287 10*3/uL (ref 150–400)
RBC: 4.59 MIL/uL (ref 3.87–5.11)
RDW: 16.4 % — ABNORMAL HIGH (ref 11.5–15.5)
WBC: 15.9 10*3/uL — ABNORMAL HIGH (ref 4.0–10.5)
nRBC: 0 % (ref 0.0–0.2)

## 2021-11-21 LAB — URINALYSIS, ROUTINE W REFLEX MICROSCOPIC

## 2021-11-21 LAB — TROPONIN I (HIGH SENSITIVITY)
Troponin I (High Sensitivity): <2 ng/L (ref <18)
Troponin I (High Sensitivity): 3 ng/L (ref <18)

## 2021-11-21 LAB — SAMPLE TO BLOOD BANK

## 2021-11-21 LAB — LIPASE, BLOOD: Lipase: 30 U/L (ref 11–51)

## 2021-11-21 NOTE — ED Notes (Signed)
Called x2 for vitals and no response 

## 2021-11-21 NOTE — ED Notes (Signed)
Called x4 for vitals and no response  

## 2021-11-21 NOTE — ED Provider Triage Note (Signed)
Emergency Medicine Provider Triage Evaluation Note  Crystal Short , a 31 y.o. female  was evaluated in triage.  Pt complains of lower back pain that shoots up the rest of her back.    She reports that she has been passing out.    She reports that she has passed out 5 times.  She is having blood in the toilet, unsure if she is is having blood in her urine or if its vaginal.    Physical Exam  BP 118/89 (BP Location: Left Arm)   Pulse 87   Temp 99.4 F (37.4 C) (Oral)   Resp 20   Ht 5\' 5"  (1.651 m)   Wt 60.8 kg   LMP 11/04/2021 (Exact Date)   SpO2 99%   BMI 22.30 kg/m  Gen:   Awake, no distress   Resp:  Normal effort  MSK:   Moves extremities without difficulty  Other:  Normal speech  Medical Decision Making  Medically screening exam initiated at 3:52 PM.  Appropriate orders placed.  Deiona Hooper was informed that the remainder of the evaluation will be completed by another provider, this initial triage assessment does not replace that evaluation, and the importance of remaining in the ED until their evaluation is complete.       Thomes Dinning, Cristina Gong 11/21/21 1600

## 2021-11-21 NOTE — ED Triage Notes (Signed)
Pt reports b/l flank pain that radiates to her middle back and goes up her spine and to her rib cage. She reports blood in the toilet, unsure if its coming from urine or vaginal. LMP last month 6/23. Pt tearful at triage. Pt reports dysuria. Onset 7/6.

## 2021-12-04 ENCOUNTER — Other Ambulatory Visit: Payer: Self-pay

## 2021-12-04 ENCOUNTER — Inpatient Hospital Stay
Admission: EM | Admit: 2021-12-04 | Discharge: 2021-12-12 | DRG: 660 | Disposition: A | Discharge status: Home / Self Care | Payer: Medicaid Other | Attending: Internal Medicine | Admitting: Internal Medicine

## 2021-12-04 ENCOUNTER — Emergency Department: Payer: Medicaid Other

## 2021-12-04 ENCOUNTER — Encounter: Payer: Self-pay | Admitting: Emergency Medicine

## 2021-12-04 DIAGNOSIS — Z832 Family history of diseases of the blood and blood-forming organs and certain disorders involving the immune mechanism: Secondary | ICD-10-CM

## 2021-12-04 DIAGNOSIS — F17290 Nicotine dependence, other tobacco product, uncomplicated: Secondary | ICD-10-CM | POA: Diagnosis present

## 2021-12-04 DIAGNOSIS — N760 Acute vaginitis: Secondary | ICD-10-CM | POA: Diagnosis present

## 2021-12-04 DIAGNOSIS — R310 Gross hematuria: Secondary | ICD-10-CM | POA: Diagnosis not present

## 2021-12-04 DIAGNOSIS — N172 Acute kidney failure with medullary necrosis: Principal | ICD-10-CM

## 2021-12-04 DIAGNOSIS — N136 Pyonephrosis: Secondary | ICD-10-CM | POA: Diagnosis present

## 2021-12-04 DIAGNOSIS — Z91018 Allergy to other foods: Secondary | ICD-10-CM

## 2021-12-04 DIAGNOSIS — Z79899 Other long term (current) drug therapy: Secondary | ICD-10-CM

## 2021-12-04 DIAGNOSIS — N12 Tubulo-interstitial nephritis, not specified as acute or chronic: Secondary | ICD-10-CM

## 2021-12-04 DIAGNOSIS — K920 Hematemesis: Secondary | ICD-10-CM | POA: Diagnosis present

## 2021-12-04 DIAGNOSIS — D573 Sickle-cell trait: Secondary | ICD-10-CM | POA: Diagnosis present

## 2021-12-04 DIAGNOSIS — D649 Anemia, unspecified: Secondary | ICD-10-CM | POA: Diagnosis present

## 2021-12-04 DIAGNOSIS — K922 Gastrointestinal hemorrhage, unspecified: Secondary | ICD-10-CM | POA: Diagnosis present

## 2021-12-04 DIAGNOSIS — R42 Dizziness and giddiness: Secondary | ICD-10-CM

## 2021-12-04 DIAGNOSIS — R519 Headache, unspecified: Secondary | ICD-10-CM | POA: Diagnosis present

## 2021-12-04 DIAGNOSIS — G43109 Migraine with aura, not intractable, without status migrainosus: Secondary | ICD-10-CM | POA: Diagnosis present

## 2021-12-04 DIAGNOSIS — D62 Acute posthemorrhagic anemia: Secondary | ICD-10-CM

## 2021-12-04 LAB — COMPREHENSIVE METABOLIC PANEL
ALT: 14 U/L (ref 0–44)
AST: 18 U/L (ref 15–41)
Albumin: 4.2 g/dL (ref 3.5–5.0)
Alkaline Phosphatase: 49 U/L (ref 38–126)
Anion gap: 6 (ref 5–15)
BUN: 15 mg/dL (ref 6–20)
CO2: 24 mmol/L (ref 22–32)
Calcium: 9.4 mg/dL (ref 8.9–10.3)
Chloride: 110 mmol/L (ref 98–111)
Creatinine, Ser: 0.85 mg/dL (ref 0.44–1.00)
GFR, Estimated: >60 mL/min (ref >60)
Glucose, Bld: 89 mg/dL (ref 70–99)
Potassium: 3.8 mmol/L (ref 3.5–5.1)
Sodium: 140 mmol/L (ref 135–145)
Total Bilirubin: 0.6 mg/dL (ref 0.3–1.2)
Total Protein: 7.5 g/dL (ref 6.5–8.1)

## 2021-12-04 LAB — CBC
HCT: 26.6 % — ABNORMAL LOW (ref 36.0–46.0)
Hemoglobin: 8.5 g/dL — ABNORMAL LOW (ref 12.0–15.0)
MCH: 25.3 pg — ABNORMAL LOW (ref 26.0–34.0)
MCHC: 32 g/dL (ref 30.0–36.0)
MCV: 79.2 fL — ABNORMAL LOW (ref 80.0–100.0)
Platelets: 685 10*3/uL — ABNORMAL HIGH (ref 150–400)
RBC: 3.36 MIL/uL — ABNORMAL LOW (ref 3.87–5.11)
RDW: 16 % — ABNORMAL HIGH (ref 11.5–15.5)
WBC: 8.1 10*3/uL (ref 4.0–10.5)
nRBC: 0 % (ref 0.0–0.2)

## 2021-12-04 LAB — URINALYSIS, COMPLETE (UACMP) WITH MICROSCOPIC
Bacteria, UA: NONE SEEN
RBC / HPF: >50 RBC/hpf — ABNORMAL HIGH (ref 0–5)
Specific Gravity, Urine: 1.022 (ref 1.005–1.030)
WBC, UA: NONE SEEN WBC/hpf (ref 0–5)

## 2021-12-04 LAB — URINALYSIS, ROUTINE W REFLEX MICROSCOPIC: Specific Gravity, Urine: 1.022 (ref 1.005–1.030)

## 2021-12-04 LAB — POC URINE PREG, ED: Preg Test, Ur: NEGATIVE

## 2021-12-04 MED ORDER — CEFEPIME HCL 2 G IV SOLR
2.0000 g | Freq: Two times a day (BID) | INTRAVENOUS | Status: DC
Start: 2021-12-05 — End: 2021-12-05
  Administered 2021-12-05 (×2): 2 g via INTRAVENOUS
  Filled 2021-12-04 (×2): qty 12.5

## 2021-12-04 MED ORDER — SODIUM CHLORIDE 0.9 % IV SOLN: 10.0000 mL/h | Freq: Once | INTRAVENOUS | Status: DC

## 2021-12-04 MED ORDER — MORPHINE SULFATE (PF) 2 MG/ML IV SOLN
2.0000 mg | Freq: Once | INTRAVENOUS | Status: AC
  Administered 2021-12-04: 2 mg via INTRAVENOUS
  Filled 2021-12-04: qty 1

## 2021-12-04 MED ORDER — MORPHINE SULFATE (PF) 4 MG/ML IV SOLN
4.0000 mg | Freq: Once | INTRAVENOUS | Status: AC
  Administered 2021-12-04: 4 mg via INTRAVENOUS
  Filled 2021-12-04: qty 1

## 2021-12-04 MED ORDER — ONDANSETRON HCL 4 MG/2ML IJ SOLN
4.0000 mg | Freq: Once | INTRAMUSCULAR | Status: AC
  Administered 2021-12-04: 4 mg via INTRAVENOUS
  Filled 2021-12-04: qty 2

## 2021-12-04 NOTE — ED Provider Notes (Signed)
[facility] Provider Note    Event Date/Time   First MD Initiated Contact with Patient 12/04/21 2306     (approximate)   History   Hematuria   HPI  Norrene Whalley is a 31 y.o. female with a history of hemorrhagic cystitis and pyelonephritis  Started having symptoms of blood in her urine once again about a few weeks ago, recently at [facility].  He is continue to pass femoral to clot.  No vaginal bleeding.  No nausea or vomiting.  She has had pain in both of her back kidney area and around her bladder and is been stinging and burning and very uncomfortable with urination for 2 days and she had a fever yesterday   Patient's discharge summary from [facility] she was diagnosed with E. coli and treated with Rocephin and home on Cipro for hematuria at that point suspect pyelonephritis  Pain in her back is improved in bilateral flanks, and is now moderate after receiving morphine  Denies history of easy bruising or heavy bleeding  She has for the last few days been experiencing lightheadedness with standing and shortness of breath with exerting herself, feels at times like she is about to pass out when she tries to walk or stand up quickly  Physical Exam   Triage Vital Signs: ED Triage Vitals [12/04/21 1538]  Enc Vitals Group     BP 129/89     Pulse Rate 87     Resp 17     Temp 98.4 F (36.9 C)     Temp Source Oral     SpO2 100 %     Weight      Height      Head Circumference      Peak Flow      Pain Score 10     Pain Loc      Pain Edu?      Excl. in [place]?     Most recent vital signs: Vitals:   12/04/21 2244 12/04/21 2355  BP: 112/78 133/75  Pulse: (!) 56 (!) 52  Resp: 16 16  Temp: 98.2 F (36.8 C)   SpO2: 100% 100%     General: Awake, no distress.  Very pleasant CV:  Good peripheral perfusion.  Warm well-perfused extremity Resp:  Normal effort.  Normal respirations and speech Abd:  No distention.  Bilateral costovertebral angle  tenderness to percussion.  Moderate tenderness suprapubically.  No evidence of peritonitis or focal abdominal tenderness in any particular quadrant other than the suprapubic region. Other:     ED Results / Procedures / Treatments   Labs (all labs ordered are listed, but only abnormal results are displayed) Labs Reviewed  URINALYSIS, ROUTINE W REFLEX MICROSCOPIC - Abnormal; Notable for the following components:      Result Value   Color, Urine RED (*)    APPearance CLOUDY (*)    Glucose, UA   (*)    Value: TEST NOT REPORTED DUE TO COLOR INTERFERENCE OF URINE PIGMENT   Hgb urine dipstick   (*)    Value: TEST NOT REPORTED DUE TO COLOR INTERFERENCE OF URINE PIGMENT   Bilirubin Urine   (*)    Value: TEST NOT REPORTED DUE TO COLOR INTERFERENCE OF URINE PIGMENT   Ketones, ur   (*)    Value: TEST NOT REPORTED DUE TO COLOR INTERFERENCE OF URINE PIGMENT   Protein, ur   (*)    Value: TEST NOT REPORTED DUE TO COLOR INTERFERENCE OF  URINE PIGMENT   Nitrite   (*)    Value: TEST NOT REPORTED DUE TO COLOR INTERFERENCE OF URINE PIGMENT   Leukocytes,Ua   (*)    Value: TEST NOT REPORTED DUE TO COLOR INTERFERENCE OF URINE PIGMENT   All other components within normal limits  CBC - Abnormal; Notable for the following components:   RBC 3.36 (*)    Hemoglobin 8.5 (*)    HCT 26.6 (*)    MCV 79.2 (*)    MCH 25.3 (*)    RDW 16.0 (*)    Platelets 685 (*)    All other components within normal limits  URINALYSIS, COMPLETE (UACMP) WITH MICROSCOPIC - Abnormal; Notable for the following components:   Color, Urine RED (*)    APPearance CLOUDY (*)    Glucose, UA   (*)    Value: TEST NOT REPORTED DUE TO COLOR INTERFERENCE OF URINE PIGMENT   Hgb urine dipstick   (*)    Value: TEST NOT REPORTED DUE TO COLOR INTERFERENCE OF URINE PIGMENT   Bilirubin Urine   (*)    Value: TEST NOT REPORTED DUE TO COLOR INTERFERENCE OF URINE PIGMENT   Ketones, ur   (*)    Value: TEST NOT REPORTED DUE TO COLOR INTERFERENCE OF  URINE PIGMENT   Protein, ur   (*)    Value: TEST NOT REPORTED DUE TO COLOR INTERFERENCE OF URINE PIGMENT   Nitrite   (*)    Value: TEST NOT REPORTED DUE TO COLOR INTERFERENCE OF URINE PIGMENT   Leukocytes,Ua   (*)    Value: TEST NOT REPORTED DUE TO COLOR INTERFERENCE OF URINE PIGMENT   RBC / HPF >50 (*)    All other components within normal limits  URINE CULTURE  COMPREHENSIVE METABOLIC PANEL  PROTIME-INR  URINALYSIS, MICROSCOPIC (REFLEX)  VITAMIN B12  FOLATE  IRON AND TIBC  FERRITIN  RETICULOCYTES  T4, FREE  TSH  COMPREHENSIVE METABOLIC PANEL  CBC  HEPATITIS PANEL, ACUTE  HIV ANTIBODY (ROUTINE TESTING W REFLEX)  POC URINE PREG, ED  PREPARE RBC (CROSSMATCH)  TYPE AND SCREEN  ABO/RH   Labs interpreted as notable anemia especially when compared to previous labs from 2 weeks ago she has dropped her hemoglobin several points.  EKG     RADIOLOGY CT abdomen pelvis interpreted by me for gross pathology at negative for acute gross pathology   CT ABDOMEN PELVIS WO CONTRAST  Result Date: 12/04/2021 CLINICAL DATA:  Pelvic pain. Negative beta HCG. Hematuria. Epigastric pain. Hematuria and hematemesis for 3 weeks. Patient has been treated with antibiotics for kidney infection for 2 weeks. Syncopal episodes for 3 weeks. EXAM: CT ABDOMEN AND PELVIS WITHOUT CONTRAST TECHNIQUE: Multidetector CT imaging of the abdomen and pelvis was performed following the standard protocol without IV contrast. RADIATION DOSE REDUCTION: This exam was performed according to the departmental dose-optimization program which includes automated exposure control, adjustment of the mA and/or kV according to patient size and/or use of iterative reconstruction technique. COMPARISON:  08/26/2016 FINDINGS: Lower chest: Lung bases are clear. Hepatobiliary: Layering density in the gallbladder consistent with small stones or milk of calcium. No gallbladder wall thickening or infiltration. No focal liver lesions. Pancreas:  Unremarkable. No pancreatic ductal dilatation or surrounding inflammatory changes. Spleen: Normal in size without focal abnormality. Adrenals/Urinary Tract: Adrenal glands are unremarkable. Kidneys are normal, without renal calculi, focal lesion, or hydronephrosis. Bladder is decompressed, limiting evaluation but no focal lesions or stones are identified. Stomach/Bowel: Stomach is within normal limits. Appendix appears  normal. No evidence of bowel wall thickening, distention, or inflammatory changes. Vascular/Lymphatic: No significant vascular findings are present. No enlarged abdominal or pelvic lymph nodes. Reproductive: Uterus and bilateral adnexa are unremarkable. Other: No abdominal wall hernia or abnormality. No abdominopelvic ascites. Musculoskeletal: No acute or significant osseous findings. IMPRESSION: 1. Cholelithiasis with small stones versus milk of calcium layering in the gallbladder. No inflammatory changes appreciated. 2. No evidence of bowel obstruction or inflammation. 3. No renal or ureteral stone or obstruction. Electronically Signed   By: Lucienne Capers M.D.   On: 12/04/2021 18:11    Renal ultrasound ordered to evaluate for possible inflammatory changes or hyperemia of the renal cortex evidence of pyelonephritis etc.  Pending at the time of admission decision   PROCEDURES:  Critical Care performed: Yes, see critical care procedure note(s)  Procedures  CRITICAL CARE Performed by: Delman Kitten   Total critical care time: 35 minutes  Critical care time was exclusive of separately billable procedures and treating other patients.  Critical care was necessary to treat or prevent imminent or life-threatening deterioration.  Critical care was time spent personally by me on the following activities: development of treatment plan with patient and/or surrogate as well as nursing, discussions with consultants, evaluation of patient's response to treatment, examination of patient,  obtaining history from patient or surrogate, ordering and performing treatments and interventions, ordering and review of laboratory studies, ordering and review of radiographic studies, pulse oximetry and re-evaluation of patient's condition.  Patient with active bleeding, symptoms of anemia, gross hematuria, receiving blood transfusion  MEDICATIONS ORDERED IN ED: Medications  0.9 %  sodium chloride infusion (0 mL/hr Intravenous Hold 12/05/21 0035)  ceFEPIme (MAXIPIME) 2 g in sodium chloride 0.9 % 100 mL IVPB (2 g Intravenous New Bag/Given 12/05/21 0025)  pantoprazole (PROTONIX) 80 mg /NS 100 mL IVPB (has no administration in time range)  pantoprozole (PROTONIX) 80 mg /NS 100 mL infusion (has no administration in time range)  pantoprazole (PROTONIX) injection 40 mg (has no administration in time range)  sodium chloride flush (NS) 0.9 % injection 3 mL (has no administration in time range)  0.9 %  sodium chloride infusion (0 mLs Intravenous Hold 12/05/21 0035)  acetaminophen (TYLENOL) tablet 650 mg (has no administration in time range)    Or  acetaminophen (TYLENOL) suppository 650 mg (has no administration in time range)  morphine (PF) 2 MG/ML injection 1 mg (has no administration in time range)  morphine (PF) 4 MG/ML injection 4 mg (4 mg Intravenous Given 12/04/21 2306)  ondansetron (ZOFRAN) injection 4 mg (4 mg Intravenous Given 12/04/21 2305)  morphine (PF) 2 MG/ML injection 2 mg (2 mg Intravenous Given 12/04/21 2356)     IMPRESSION / MDM / ASSESSMENT AND PLAN / ED COURSE  I reviewed the triage vital signs and the nursing notes.                              Differential diagnosis includes, but is not limited to, hemorrhagic cystitis, also suspect clinically she may have bilateral pyelonephritis, she has obvious hematuria and denies any vaginal bleeding or gastric bleeding.  She is alert well oriented her hemodynamics are stable but hemoglobin significantly decreased  Discussed risks and  benefits and patient verbally consents to blood transfusion.  In addition, she is agreeable to review and plan signing consent which nursing will perform    Patient's presentation is most consistent with acute presentation with potential threat to  life or bodily function.  The patient is on the cardiac monitor to evaluate for evidence of arrhythmia and/or significant heart rate changes.  No clinical exam findings suggest cholecystitis.  Patient understanding agreeable plan for admission.  Reviewed culture data from [facility] and I have selected cefepime for initial antibiotic coverage at this time as we await culture.  She does not show evidence of acute sepsis at this time  Case consulted with hospitalist, after consultation patient will be admitted to the hospital service under the care of Dr. Renaldo Reel     FINAL CLINICAL IMPRESSION(S) / ED DIAGNOSES   Final diagnoses:  Gross hematuria  Pyelonephritis  Acute blood loss anemia  Symptomatic anemia secondary to acute blood loss   Rx / DC Orders   ED Discharge Orders     None        Note:  This document was prepared using Dragon voice recognition software and may include unintentional dictation errors.   Sharyn Creamer, MD 12/05/21 [phone]

## 2021-12-04 NOTE — ED Provider Triage Note (Signed)
Emergency Medicine Provider Triage Evaluation Note  Crystal Short , a 31 y.o. female  was evaluated in triage.  Pt complains of painful urination with blood in urine x3 weeks.  Was seen at [facility] just over 1 week ago and diagnosed with pyelonephritis, placed on antibiotics.  Patient states she is completed the antibiotics but still having symptoms.  Patient states she is also having vaginal bleeding.  Review of Systems  Positive: Dysuria, hematuria, vomiting, vaginal bleeding, pelvic pain, back pain Negative: Chest pain, shortness of breath, fevers  Physical Exam  BP 129/89 (BP Location: Right Arm)   Pulse 87   Temp 98.4 F (36.9 C) (Oral)   Resp 17   LMP 11/04/2021 (Exact Date)   SpO2 100%  Gen:   Awake, no distress   Resp:  Normal effort  MSK:   Moves extremities without difficulty  Other:    Medical Decision Making  Medically screening exam initiated at 3:40 PM.  Appropriate orders placed.  Crystal Short was informed that the remainder of the evaluation will be completed by another provider, this initial triage assessment does not replace that evaluation, and the importance of remaining in the ED until their evaluation is complete.     Evon Slack, [place] 12/04/21 1547

## 2021-12-04 NOTE — H&P (Signed)
History and Physical    Crystal Short ZOX:[id number] DOB: [birth year] DOA: 12/04/2021  PCP: Patient, No Pcp Per    Patient coming from:  Home   Chief Complaint:  Hematuria   HPI:  Crystal DinningJasmine Short is a 31 y.o. female seen in ed with complaints of hematuria since July 7. Patient states that she has been dealing with this since beginning of this month.  And comes back to us with same complaints of hematuria and this morning when she had nausea and vomiting she had blood in her vomitus. Patient does not report noting any blood in her stools although patient is strongly guaiac positive on rectal exam in the emergency room. Patient still reports of headaches and dizziness that she takes her Marlin CanaryGoody powders for.  And a history of syphilis no other STD history.  Patient does not smoke and occasionally uses alcohol. >Hospital discharge summary from 11/27/2021 shows that patient was [facility] with complaints of flank pain diagnosed with urinary tract infection and pyelonephritis urology was consulted for hematuria with plan for completion of antibiotic treatment and follow-up with urology as outpatient.  Patient was discharged with ciprofloxacin 500 mg for 10 doses.  Patient was admitted with acute E. coli bacterial pyelonephritis with gross hematuria.  Patient was found to be anemic and attributed to menstrual blood loss although today patient reports to me that her periods are normal she does not have heavy periods. >Chart review shows that [facility]. >Patient had a CT renal mass on November 26 2021 showing: KIDNEYS:    Right Kidney: Striated nephrogram. Hypodense lesion of the right mid kidney involving the renal cortex measuring 1.9 cm which persists on delayed phase, similar to prior (4:42).    Left Kidney: Striated nephrogram. Nonobstructing punctate renal calculi in the upper pole, unchanged in position. No hydronephrosis. 0.7 cm hypodense  lesion in the upper pole left kidney, unchanged (10:18)  VISUALIZED GI TRACT: No findings of bowel obstruction or acute inflammation.  PERITONEUM, RETROPERITONEUM AND MESENTERY: No free air.  No ascites.  No fluid collection.  LYMPH NODES: No adenopathy.  VESSELS: Hepatic and portal veins are patent.  Normal caliber aorta.    BONES and SOFT TISSUES: No aggressive osseous lesions.  No focal soft tissue lesions.  IMPRESSION:  -Persistent bilateral mildly striated nephrograms compatible with history of pyelonephritis. Redemonstrated focal hypoechoic lesions in the bilateral kidneys, similar to prior. Recommend follow-up to resolution. >EXAM: US RENAL COMPLETE  DATE: 11/25/2021  ACCESSION: [id number]  DICTATED: 11/25/2021 1:13 PM  [facility]  CLINICAL INDICATION: 31 years old Female with Pyelonephritis, hematuria    COMPARISON: Renal ultrasound 12/20/2020, CT abdomen and pelvis 11/23/2021.  TECHNIQUE: Static and cine images of the kidneys and bladder were performed.  FINDINGS:  KIDNEYS: Normal size with several subtle regions of decreased echogenicity bilaterally (right greater than left). No solid masses or calculi. No hydronephrosis.       Right kidney: 11.3 cm       Left kidney: 10.7 cm  BLADDER: Partially distended. Within the bladder, there is a mobile, heterogeneous, though predominantly hyperechoic structure with several lacelike echoes, favored to reflect blood products. No evidence of active bleeding.   ED Course:   Vitals:   12/04/21 1538 12/04/21 1849 12/04/21 2244 12/04/21 2355  BP: 129/89 102/80 112/78 133/75  Pulse: 87 72 (!) 56 (!) 52  Resp: 17 16 16 16   Temp: 98.4 F (36.9 C) 98.2 F (36.8  C) 98.2 F (36.8 C)   TempSrc: Oral Oral Oral   SpO2: 100% 100% 100% 100%  In the emergency room patient is alert awake oriented stable O2 sats of 100% on room air. CMP shows normal electrolytes kidney function and LFTs. CBC shows normal white count  hemoglobin of 8.5 RDW of 16 and platelets of 685. Urinalysis shows blood more than 50 RBCs and cloudy. CT of the abdomen and pelvis without contrast shows: IMPRESSION: 1. Cholelithiasis with small stones versus milk of calcium layering in the gallbladder. No inflammatory changes appreciated. 2. No evidence of bowel obstruction or inflammation. 3. No renal or ureteral stone or obstruction. Electronically Signed   By: Burman Nieves M.D.   On: 12/04/2021 18:11  Review of Systems:  Review of Systems  Gastrointestinal:  Positive for abdominal pain and nausea.  Genitourinary:  Positive for hematuria.  Musculoskeletal:  Positive for back pain.  Neurological:  Positive for dizziness and headaches.      Past Medical History:  Diagnosis Date   Syphilis 09/15/2016   reactive RPR 1:8   Trichomonas infection     Past Surgical History:  Procedure Laterality Date   NO PAST SURGERIES       reports that she has been smoking e-cigarettes and cigars. She has never used smokeless tobacco. She reports current alcohol use of about 8.0 standard drinks of alcohol per week. She reports current drug use. Drug: Marijuana.  Allergies  Allergen Reactions   Orange Fruit [Citrus] Swelling    Swelling is of the tongue.    Family History  Problem Relation Age of Onset   Diabetes Mother    Lung disease Mother    Lung cancer Mother    Diabetes Brother    Diabetes Maternal Grandmother    Sickle cell trait Father    Sickle cell trait Paternal Grandmother    Congestive Heart Failure Paternal Grandfather    Sickle cell anemia Half-Sister     Prior to Admission medications   Medication Sig Start Date End Date Taking? Authorizing Provider  amoxicillin (AMOXIL) 500 MG capsule Take 500 mg by mouth 3 (three) times daily. Patient not taking: Reported on 11/22/2020 10/04/20   [provider]  HYDROcodone-acetaminophen (NORCO/VICODIN) 5-325 MG tablet Take by mouth. Patient not taking: Reported  on 11/22/2020 10/04/20   [provider]  hydrOXYzine (ATARAX/VISTARIL) 50 MG tablet Take 1 tablet (50 mg total) by mouth 3 (three) times daily as needed for itching. Patient not taking: Reported on 03/12/2020 11/19/18   Joni Reining, PA-C  ibuprofen (ADVIL) 600 MG tablet Take 600 mg by mouth 3 (three) times daily. Patient not taking: Reported on 11/22/2020 10/04/20   [provider]  ondansetron (ZOFRAN ODT) 4 MG disintegrating tablet Take 1 tablet (4 mg total) by mouth every 8 (eight) hours as needed for nausea or vomiting. Patient not taking: Reported on 11/22/2020 11/16/20   Concha Se, MD  sulfamethoxazole-trimethoprim (BACTRIM DS) 800-160 MG tablet Take 1 tablet by mouth 2 (two) times daily. Patient not taking: Reported on 03/12/2020 11/19/18   Joni Reining, PA-C    Physical Exam: Vitals:   12/04/21 1538 12/04/21 1849 12/04/21 2244 12/04/21 2355  BP: 129/89 102/80 112/78 133/75  Pulse: 87 72 (!) 56 (!) 52  Resp: 17 16 16 16   Temp: 98.4 F (36.9 C) 98.2 F (36.8 C) 98.2 F (36.8 C)   TempSrc: Oral Oral Oral   SpO2: 100% 100% 100% 100%   Physical Exam Vitals reviewed.  Constitutional:      General: She is not in acute distress.    Appearance: Normal appearance.  HENT:     Head: Normocephalic and atraumatic.     Right Ear: Hearing normal.     Left Ear: Hearing normal.     Nose: No nasal deformity, septal deviation, signs of injury, laceration, nasal tenderness, mucosal edema, congestion or rhinorrhea.     Right Nostril: No foreign body or epistaxis.     Left Nostril: No foreign body or epistaxis.     Right Turbinates: Pale. Not enlarged or swollen.     Left Turbinates: Pale. Not enlarged or swollen.     Mouth/Throat:     Lips: Pink.     Mouth: Mucous membranes are moist.     Dentition: Normal dentition.     Tongue: No lesions. Tongue does not deviate from midline.     Palate: No mass and lesions.     Pharynx: Oropharynx is clear. No posterior  oropharyngeal erythema.     Tonsils: No tonsillar exudate or tonsillar abscesses.  Eyes:     Extraocular Movements: Extraocular movements intact.     Pupils: Pupils are equal, round, and reactive to light.  Neck:     Vascular: No carotid bruit.  Cardiovascular:     Rate and Rhythm: Normal rate and regular rhythm.     Pulses: Normal pulses.     Heart sounds: Normal heart sounds.  Pulmonary:     Effort: Pulmonary effort is normal.     Breath sounds: Normal breath sounds.  Abdominal:     General: Bowel sounds are normal. There is no distension.     Palpations: Abdomen is soft.     Tenderness: There is abdominal tenderness. There is guarding.  Genitourinary:    Rectum: Guaiac result positive.  Musculoskeletal:     Right lower leg: No edema.     Left lower leg: No edema.  Skin:    General: Skin is warm and dry.  Neurological:     General: No focal deficit present.     Mental Status: She is alert and oriented to person, place, and time.  Psychiatric:        Mood and Affect: Mood normal.        Behavior: Behavior normal.      Labs on Admission: I have personally reviewed following labs and imaging studies BMET Recent Labs  Lab 12/04/21 1547  NA 140  K 3.8  CL 110  CO2 24  BUN 15  CREATININE 0.85  GLUCOSE 89   Electrolytes Recent Labs  Lab 12/04/21 1547  CALCIUM 9.4   Sepsis Markers No results for input(s): "LATICACIDVEN", "PROCALCITON", "O2SATVEN" in the last 168 hours. ABG No results for input(s): "PHART", "PCO2ART", "PO2ART" in the last 168 hours. Liver Enzymes Recent Labs  Lab 12/04/21 1547  AST 18  ALT 14  ALKPHOS 49  BILITOT 0.6  ALBUMIN 4.2   Cardiac Enzymes No results for input(s): "TROPONINI", "PROBNP" in the last 168 hours. No results found for: "DDIMER" Coag's Recent Labs  Lab 12/04/21 2358  INR 1.1    No results found for this or any previous visit (from the past 240 hour(s)).   Current Facility-Administered Medications:    0.9 %   sodium chloride infusion, 10 mL/hr, Intravenous, Once, Gertha Calkin, MD, Held at 12/05/21 0035   0.9 %  sodium chloride infusion, , Intravenous, Continuous, Gertha Calkin, MD, Held at 12/05/21 0035   acetaminophen (TYLENOL) tablet  650 mg, 650 mg, Oral, Q6H PRN **OR** acetaminophen (TYLENOL) suppository 650 mg, 650 mg, Rectal, Q6H PRN, Allena Katz, Eliezer Mccoy, MD   ceFEPIme (MAXIPIME) 2 g in sodium chloride 0.9 % 100 mL IVPB, 2 g, Intravenous, Q12H, Gertha Calkin, MD, Last Rate: 200 mL/hr at 12/05/21 0025, 2 g at 12/05/21 0025   morphine (PF) 2 MG/ML injection 1 mg, 1 mg, Intravenous, Q4H PRN, Gertha Calkin, MD   pantoprazole (PROTONIX) 80 mg /NS 100 mL IVPB, 80 mg, Intravenous, Once, Gertha Calkin, MD   [START ON 12/08/2021] pantoprazole (PROTONIX) injection 40 mg, 40 mg, Intravenous, Q12H, Kiarrah Rausch V, MD   pantoprozole (PROTONIX) 80 mg /NS 100 mL infusion, 8 mg/hr, Intravenous, Continuous, Antoneo Ghrist V, MD   sodium chloride flush (NS) 0.9 % injection 3 mL, 3 mL, Intravenous, Q12H, Gertha Calkin, MD  Current Outpatient Medications:    amoxicillin (AMOXIL) 500 MG capsule, Take 500 mg by mouth 3 (three) times daily. (Patient not taking: Reported on 11/22/2020), Disp: , Rfl:    ciprofloxacin (CIPRO) 500 MG tablet, Take 500 mg by mouth 2 (two) times daily., Disp: , Rfl:    HYDROcodone-acetaminophen (NORCO/VICODIN) 5-325 MG tablet, Take by mouth. (Patient not taking: Reported on 11/22/2020), Disp: , Rfl:    hydrOXYzine (ATARAX/VISTARIL) 50 MG tablet, Take 1 tablet (50 mg total) by mouth 3 (three) times daily as needed for itching. (Patient not taking: Reported on 03/12/2020), Disp: 15 tablet, Rfl: 0   ibuprofen (ADVIL) 600 MG tablet, Take 600 mg by mouth 3 (three) times daily. (Patient not taking: Reported on 11/22/2020), Disp: , Rfl:    ondansetron (ZOFRAN ODT) 4 MG disintegrating tablet, Take 1 tablet (4 mg total) by mouth every 8 (eight) hours as needed for nausea or vomiting. (Patient not taking: Reported on  11/22/2020), Disp: 20 tablet, Rfl: 0   oxyCODONE-acetaminophen (PERCOCET/ROXICET) 5-325 MG tablet, Take 1 tablet by mouth every 8 (eight) hours as needed., Disp: , Rfl:    sulfamethoxazole-trimethoprim (BACTRIM DS) 800-160 MG tablet, Take 1 tablet by mouth 2 (two) times daily. (Patient not taking: Reported on 03/12/2020), Disp: 20 tablet, Rfl: 0  COVID-19 Labs No results for input(s): "DDIMER", "FERRITIN", "LDH", "CRP" in the last 72 hours. Lab Results  Component Value Date   SARSCOV2NAA NEGATIVE 12/18/2020   SARSCOV2NAA NEGATIVE 11/16/2020    Radiological Exams on Admission: US Renal  Result Date: 12/04/2021 CLINICAL DATA:  Gross hematuria. EXAM: RENAL / URINARY TRACT ULTRASOUND COMPLETE COMPARISON:  None Available. FINDINGS: Right Kidney: Renal measurements: 9.8 cm x 3.2 cm x 4.2 cm = volume: 70.2 mL. Echogenicity within normal limits. No mass or hydronephrosis visualized. Left Kidney: Renal measurements: 10.0 cm x 5.6 cm x 4.3 cm = volume: 123 mL. Echogenicity within normal limits. No mass or hydronephrosis visualized. Bladder: Poorly distended and subsequently limited in evaluation. Other: None. IMPRESSION: Limited visualization of the urinary bladder, as described above, with normal ultrasonographic appearance of kidneys. Electronically Signed   By: Aram Candela M.D.   On: 12/04/2021 23:50   CT ABDOMEN PELVIS WO CONTRAST  Result Date: 12/04/2021 CLINICAL DATA:  Pelvic pain. Negative beta HCG. Hematuria. Epigastric pain. Hematuria and hematemesis for 3 weeks. Patient has been treated with antibiotics for kidney infection for 2 weeks. Syncopal episodes for 3 weeks. EXAM: CT ABDOMEN AND PELVIS WITHOUT CONTRAST TECHNIQUE: Multidetector CT imaging of the abdomen and pelvis was performed following the standard protocol without IV contrast. RADIATION DOSE REDUCTION: This exam was performed according to the departmental  dose-optimization program which includes automated exposure control,  adjustment of the mA and/or kV according to patient size and/or use of iterative reconstruction technique. COMPARISON:  08/26/2016 FINDINGS: Lower chest: Lung bases are clear. Hepatobiliary: Layering density in the gallbladder consistent with small stones or milk of calcium. No gallbladder wall thickening or infiltration. No focal liver lesions. Pancreas: Unremarkable. No pancreatic ductal dilatation or surrounding inflammatory changes. Spleen: Normal in size without focal abnormality. Adrenals/Urinary Tract: Adrenal glands are unremarkable. Kidneys are normal, without renal calculi, focal lesion, or hydronephrosis. Bladder is decompressed, limiting evaluation but no focal lesions or stones are identified. Stomach/Bowel: Stomach is within normal limits. Appendix appears normal. No evidence of bowel wall thickening, distention, or inflammatory changes. Vascular/Lymphatic: No significant vascular findings are present. No enlarged abdominal or pelvic lymph nodes. Reproductive: Uterus and bilateral adnexa are unremarkable. Other: No abdominal wall hernia or abnormality. No abdominopelvic ascites. Musculoskeletal: No acute or significant osseous findings. IMPRESSION: 1. Cholelithiasis with small stones versus milk of calcium layering in the gallbladder. No inflammatory changes appreciated. 2. No evidence of bowel obstruction or inflammation. 3. No renal or ureteral stone or obstruction. Electronically Signed   By: Burman Nieves M.D.   On: 12/04/2021 18:11    EKG: Independently reviewed.  NSR 80.    Assessment and Plan: * Gross hematuria We will get urology consult. Cont iv abx.    Hematemesis Iv ppi. Npo.  Gi consult.  AVOID nsaids.   GIB (gastrointestinal bleeding) Type/ screen/ iv ppi. Gi consult - dr Norma Fredrickson am message sent.   Dizziness Fall precaution. Orthostatic bp  X 1.  Treat anemia.  MRI of brain.   Persistent headaches persistent recurrent headaches will get MRI.     Anemia Strongly positive guaiac.  Iv ppi. D/C GOODY AND B/C.  GI CONSULT. Iv ppi drip.  Anemia panel., 1 unit PRBC.    DVT prophylaxis:  Scd's.   Code Status:  Full code    Family Communication:  [place],Carol Midwife)  [phone] (Home Phone)   Disposition Plan:  Home    Consults called:  GI; Dr Norma Fredrickson. Urology: Dr.stoiff.   Admission status: Inpatient.     Gertha Calkin MD [facility]  6 PM- 2 AM. Please contact me via secure Chat 6 PM-2 AM. [phone] ( Pager ) To contact the [facility] Attending or Consulting provider 7A - 7P or covering provider during after hours 7P -7A, for this patient.   Check the care team in [facility] and look for a) attending/consulting [facility] provider listed and b) the [facility] team listed Log into [email or web address] and use [place]'s universal password to access. If you do not have the password, please contact the hospital operator. Locate the [facility] provider you are looking for under [facility] and page to a number that you can be directly reached. If you still have difficulty reaching the provider, please page the [facility] (Director on Call) for the Hospitalists listed on amion for assistance. [email or web address] 12/05/2021, 1:04 AM

## 2021-12-04 NOTE — ED Triage Notes (Signed)
First Nurse Note:  Pt via EMS from home. Pt c/o hematuria and hematemesis for the past 3 weeks. Pt was placed on abx for kidney infection 2 weeks ago. Pt also have been having some syncopal episode for the last 3 weeks ago. Pt is A&Ox4 and NAD  116/77 63 HR  76 CBG  98% on RA

## 2021-12-04 NOTE — Progress Notes (Signed)
Pharmacy Antibiotic Note  Crystal Short is a 31 y.o. female admitted on 12/04/2021 with UTI.  Pharmacy has been consulted for cefepime dosing.  -recent [facility] admission 7/11-7/16/23 for pyelonephritis: UCX 7/12: E.coli,  was on Ceftriaxone then d/c on cipro  Plan: Cefepime 2 gm IV q12h   F/u Urine cx  Temp (24hrs), Avg:98.3 F (36.8 C), Min:98.2 F (36.8 C), Max:98.4 F (36.9 C)  Recent Labs  Lab 12/04/21 1547  WBC 8.1  CREATININE 0.85    Estimated Creatinine Clearance: 87.9 mL/min (by C-G formula based on SCr of 0.85 mg/dL).    Allergies  Allergen Reactions   Orange Fruit [Citrus] Swelling    Swelling is of the tongue.    Antimicrobials this admission: cefepime 7/24 >>       >>    Dose adjustments this admission:    Microbiology results:   BCx:   7/23 UCx: pend    Sputum:      MRSA PCR:     Thank you for allowing pharmacy to be a part of this patient's care.  Myna Freimark A 12/04/2021 11:36 PM

## 2021-12-05 ENCOUNTER — Encounter: Payer: Self-pay | Admitting: Radiology

## 2021-12-05 ENCOUNTER — Inpatient Hospital Stay: Payer: Medicaid Other

## 2021-12-05 ENCOUNTER — Other Ambulatory Visit: Payer: Self-pay

## 2021-12-05 DIAGNOSIS — R319 Hematuria, unspecified: Secondary | ICD-10-CM | POA: Diagnosis not present

## 2021-12-05 DIAGNOSIS — D62 Acute posthemorrhagic anemia: Secondary | ICD-10-CM | POA: Diagnosis present

## 2021-12-05 DIAGNOSIS — Z79899 Other long term (current) drug therapy: Secondary | ICD-10-CM | POA: Diagnosis not present

## 2021-12-05 DIAGNOSIS — R42 Dizziness and giddiness: Secondary | ICD-10-CM | POA: Diagnosis not present

## 2021-12-05 DIAGNOSIS — D509 Iron deficiency anemia, unspecified: Secondary | ICD-10-CM

## 2021-12-05 DIAGNOSIS — R519 Headache, unspecified: Secondary | ICD-10-CM | POA: Diagnosis not present

## 2021-12-05 DIAGNOSIS — K920 Hematemesis: Secondary | ICD-10-CM | POA: Diagnosis present

## 2021-12-05 DIAGNOSIS — R1032 Left lower quadrant pain: Secondary | ICD-10-CM | POA: Diagnosis not present

## 2021-12-05 DIAGNOSIS — Z91018 Allergy to other foods: Secondary | ICD-10-CM | POA: Diagnosis not present

## 2021-12-05 DIAGNOSIS — D573 Sickle-cell trait: Secondary | ICD-10-CM | POA: Diagnosis present

## 2021-12-05 DIAGNOSIS — F17290 Nicotine dependence, other tobacco product, uncomplicated: Secondary | ICD-10-CM | POA: Diagnosis present

## 2021-12-05 DIAGNOSIS — N760 Acute vaginitis: Secondary | ICD-10-CM | POA: Diagnosis present

## 2021-12-05 DIAGNOSIS — D649 Anemia, unspecified: Secondary | ICD-10-CM | POA: Diagnosis present

## 2021-12-05 DIAGNOSIS — N172 Acute kidney failure with medullary necrosis: Secondary | ICD-10-CM | POA: Diagnosis present

## 2021-12-05 DIAGNOSIS — K922 Gastrointestinal hemorrhage, unspecified: Secondary | ICD-10-CM | POA: Diagnosis present

## 2021-12-05 DIAGNOSIS — Z832 Family history of diseases of the blood and blood-forming organs and certain disorders involving the immune mechanism: Secondary | ICD-10-CM | POA: Diagnosis not present

## 2021-12-05 DIAGNOSIS — R310 Gross hematuria: Principal | ICD-10-CM | POA: Diagnosis present

## 2021-12-05 DIAGNOSIS — N136 Pyonephrosis: Secondary | ICD-10-CM | POA: Diagnosis present

## 2021-12-05 DIAGNOSIS — N12 Tubulo-interstitial nephritis, not specified as acute or chronic: Secondary | ICD-10-CM | POA: Diagnosis not present

## 2021-12-05 LAB — CBC
HCT: 26.2 % — ABNORMAL LOW (ref 36.0–46.0)
Hemoglobin: 8.7 g/dL — ABNORMAL LOW (ref 12.0–15.0)
MCH: 27.1 pg (ref 26.0–34.0)
MCHC: 33.2 g/dL (ref 30.0–36.0)
MCV: 81.6 fL (ref 80.0–100.0)
Platelets: 539 10*3/uL — ABNORMAL HIGH (ref 150–400)
RBC: 3.21 MIL/uL — ABNORMAL LOW (ref 3.87–5.11)
RDW: 16.4 % — ABNORMAL HIGH (ref 11.5–15.5)
WBC: 7.2 10*3/uL (ref 4.0–10.5)
nRBC: 0 % (ref 0.0–0.2)

## 2021-12-05 LAB — FOLATE: Folate: 13.1 ng/mL (ref >5.9)

## 2021-12-05 LAB — TSH: TSH: 1.447 u[IU]/mL (ref 0.350–4.500)

## 2021-12-05 LAB — RETICULOCYTES
Immature Retic Fract: 25.5 % — ABNORMAL HIGH (ref 2.3–15.9)
RBC.: 3.24 MIL/uL — ABNORMAL LOW (ref 3.87–5.11)
Retic Count, Absolute: 95.6 10*3/uL (ref 19.0–186.0)
Retic Ct Pct: 3 % (ref 0.4–3.1)

## 2021-12-05 LAB — COMPREHENSIVE METABOLIC PANEL
ALT: 11 U/L (ref 0–44)
AST: 13 U/L — ABNORMAL LOW (ref 15–41)
Albumin: 3.8 g/dL (ref 3.5–5.0)
Alkaline Phosphatase: 39 U/L (ref 38–126)
Anion gap: 6 (ref 5–15)
BUN: 18 mg/dL (ref 6–20)
CO2: 24 mmol/L (ref 22–32)
Calcium: 9.3 mg/dL (ref 8.9–10.3)
Chloride: 111 mmol/L (ref 98–111)
Creatinine, Ser: 0.8 mg/dL (ref 0.44–1.00)
GFR, Estimated: >60 mL/min (ref >60)
Glucose, Bld: 86 mg/dL (ref 70–99)
Potassium: 3.8 mmol/L (ref 3.5–5.1)
Sodium: 141 mmol/L (ref 135–145)
Total Bilirubin: 1.1 mg/dL (ref 0.3–1.2)
Total Protein: 6.7 g/dL (ref 6.5–8.1)

## 2021-12-05 LAB — HEPATITIS PANEL, ACUTE
HCV Ab: NONREACTIVE
Hep A IgM: NONREACTIVE
Hep B C IgM: NONREACTIVE
Hepatitis B Surface Ag: NONREACTIVE

## 2021-12-05 LAB — PROTIME-INR
INR: 1.1 (ref 0.8–1.2)
Prothrombin Time: 14.5 seconds (ref 11.4–15.2)

## 2021-12-05 LAB — T4, FREE: Free T4: 1.24 ng/dL — ABNORMAL HIGH (ref 0.61–1.12)

## 2021-12-05 LAB — HIV ANTIBODY (ROUTINE TESTING W REFLEX): HIV Screen 4th Generation wRfx: NONREACTIVE

## 2021-12-05 LAB — FERRITIN: Ferritin: 12 ng/mL (ref 11–307)

## 2021-12-05 LAB — PREPARE RBC (CROSSMATCH)

## 2021-12-05 LAB — IRON AND TIBC
Iron: <5 ug/dL — ABNORMAL LOW (ref 28–170)
TIBC: 414 ug/dL (ref 250–450)

## 2021-12-05 LAB — VITAMIN B12: Vitamin B-12: 395 pg/mL (ref 180–914)

## 2021-12-05 LAB — ABO/RH: ABO/RH(D): B POS

## 2021-12-05 MED ORDER — PANTOPRAZOLE INFUSION (NEW) - SIMPLE MED
8.0000 mg/h | INTRAVENOUS | Status: DC
  Administered 2021-12-05 – 2021-12-07 (×6): 8 mg/h via INTRAVENOUS
  Filled 2021-12-05 (×6): qty 100

## 2021-12-05 MED ORDER — SODIUM CHLORIDE 0.9% FLUSH
3.0000 mL | Freq: Two times a day (BID) | INTRAVENOUS | Status: DC
  Administered 2021-12-05 – 2021-12-12 (×12): 3 mL via INTRAVENOUS

## 2021-12-05 MED ORDER — PANTOPRAZOLE 80MG IVPB - SIMPLE MED
80.0000 mg | Freq: Once | INTRAVENOUS | Status: AC
  Administered 2021-12-05: 80 mg via INTRAVENOUS
  Filled 2021-12-05: qty 100

## 2021-12-05 MED ORDER — MORPHINE SULFATE (PF) 2 MG/ML IV SOLN
1.0000 mg | INTRAVENOUS | Status: DC | PRN
  Administered 2021-12-05 – 2021-12-07 (×5): 1 mg via INTRAVENOUS
  Filled 2021-12-05 (×5): qty 1

## 2021-12-05 MED ORDER — SODIUM CHLORIDE 0.9 % IV SOLN
2.0000 g | Freq: Three times a day (TID) | INTRAVENOUS | Status: DC
Start: 2021-12-05 — End: 2021-12-07
  Administered 2021-12-05 – 2021-12-07 (×6): 2 g via INTRAVENOUS
  Filled 2021-12-05 (×7): qty 12.5

## 2021-12-05 MED ORDER — ONDANSETRON HCL 4 MG/2ML IJ SOLN
4.0000 mg | Freq: Four times a day (QID) | INTRAMUSCULAR | Status: DC | PRN
  Administered 2021-12-05 – 2021-12-12 (×7): 4 mg via INTRAVENOUS
  Filled 2021-12-05 (×7): qty 2

## 2021-12-05 MED ORDER — ACETAMINOPHEN 650 MG RE SUPP: 650.0000 mg | Freq: Four times a day (QID) | RECTAL | Status: DC | PRN

## 2021-12-05 MED ORDER — PANTOPRAZOLE SODIUM 40 MG IV SOLR: 40.0000 mg | Freq: Two times a day (BID) | INTRAVENOUS | Status: DC

## 2021-12-05 MED ORDER — SODIUM CHLORIDE 0.9 % IV SOLN: INTRAVENOUS | Status: DC

## 2021-12-05 MED ORDER — ACETAMINOPHEN 325 MG PO TABS
650.0000 mg | ORAL_TABLET | Freq: Four times a day (QID) | ORAL | Status: DC | PRN
  Administered 2021-12-05 – 2021-12-08 (×3): 650 mg via ORAL
  Filled 2021-12-05 (×3): qty 2

## 2021-12-05 NOTE — Assessment & Plan Note (Signed)
Type/ screen/ iv ppi. Gi consult - dr Norma Fredrickson am message sent.

## 2021-12-05 NOTE — Assessment & Plan Note (Signed)
Iv ppi. Npo.  Gi consult.  AVOID nsaids.

## 2021-12-05 NOTE — ED Notes (Signed)
Blood transfusion complete; VSS, patient in NAD. Green sheet from blood placed in chart.

## 2021-12-05 NOTE — Assessment & Plan Note (Signed)
Strongly positive guaiac.  Iv ppi. D/C GOODY AND B/C.  GI CONSULT. Iv ppi drip.  Anemia panel., 1 unit PRBC.

## 2021-12-05 NOTE — Assessment & Plan Note (Signed)
Fall precaution. Orthostatic bp  X 1.  Treat anemia.  MRI of brain.

## 2021-12-05 NOTE — H&P (View-Only) (Signed)
Urology Consult  I have been asked to see the patient by Dr. Allena Katz, for evaluation and management of gross hematuria.  Chief Complaint: Hematuria, hematemesis, vaginal bleeding  History of Present Illness: Crystal Short is a 31 y.o. year old female with PMH syphilis and trichomonas who presented to the ED yesterday with reports of 3 weeks of hematuria and hematemesis.  She was admitted at [facility] from 11/22/2021 to 11/27/2021 with pyelonephritis and gross hematuria. Urology was consulted, who diagnosed hemorrhagic cystitis.  Her urine culture grew multidrug-resistant E. coli and she was treated with IV Rocephin and discharged on p.o. Cipro.  CTAP with contrast yesterday with punctate nonobstructing left renal stones, but no hydronephrosis, perivesical or perinephric stranding.  There appears to be something within the bladder lumen, incompletely characterized on exam, possibly representing clot material.  Admission labs notable for UA with >50 RBCs/hpf without pyuria or bacteriuria.  Urine sample appears contained with 21-50 squamous epithelial cells/LPF.  Urine cultures pending, on antibiotics as below.  Hemoglobin is stable today at 8.7, previously 12.1 2 weeks ago.  She received 1 unit of RBCs this morning.  Today she reports bilateral back pain and intermittent dysuria.  She states that her gross hematuria improved slightly on outpatient Cipro, but it worsened again after completion of the antibiotic.  Her urine is described as dark red and she has been passing clots.  When her gross hematuria began earlier this month, she inserted a tampon because she thought it represented menses but the tampon did not capture any bleeding and she continued to have gross hematuria.  Notably, she mentions persistent headache for the past month for which she has been taking 1+ Goody's powder daily.  She reports several syncopal episodes in the past 2 weeks.  LMP >1 month ago.  Anti-infectives  (From admission, onward)    Start     Dose/Rate Route Frequency Ordered Stop   12/05/21 0000  ceFEPIme (MAXIPIME) 2 g in sodium chloride 0.9 % 100 mL IVPB        2 g 200 mL/hr over 30 Minutes Intravenous Every 12 hours 12/04/21 2328         Past Medical History:  Diagnosis Date   Syphilis 09/15/2016   reactive RPR 1:8   Trichomonas infection     Past Surgical History:  Procedure Laterality Date   NO PAST SURGERIES      Home Medications:  No outpatient medications have been marked as taking for the 12/04/21 encounter [facility] Encounter).    Allergies:  Allergies  Allergen Reactions   Orange Fruit [Citrus] Swelling    Swelling is of the tongue.    Family History  Problem Relation Age of Onset   Diabetes Mother    Lung disease Mother    Lung cancer Mother    Diabetes Brother    Diabetes Maternal Grandmother    Sickle cell trait Father    Sickle cell trait Paternal Grandmother    Congestive Heart Failure Paternal Grandfather    Sickle cell anemia Half-Sister     Social History:  reports that she has been smoking e-cigarettes and cigars. She has never used smokeless tobacco. She reports current alcohol use of about 8.0 standard drinks of alcohol per week. She reports current drug use. Drug: Marijuana.  ROS: A complete review of systems was performed.  All systems are negative except for pertinent findings as noted.  Physical Exam:  Vital signs in last 24 hours: Temp:  [97.4 F (36.3 C)-98.4  F (36.9 C)] 98.1 F (36.7 C) (07/24 0949) Pulse Rate:  [50-94] 59 (07/24 0949) Resp:  [16-20] 16 (07/24 0949) BP: (95-133)/(56-89) 95/56 (07/24 0949) SpO2:  [100 %] 100 % (07/24 0949) Constitutional:  Alert and oriented, no acute distress HEENT: [place] AT, moist mucus membranes Cardiovascular: No clubbing, cyanosis, or edema Respiratory: Normal respiratory effort Skin: No rashes, bruises or suspicious lesions Neurologic: Grossly intact, no focal deficits, moving all 4  extremities Psychiatric: Normal mood and affect  Laboratory Data:  Recent Labs    12/04/21 1547 12/05/21 0634  WBC 8.1 7.2  HGB 8.5* 8.7*  HCT 26.6* 26.2*   Recent Labs    12/04/21 1547 12/05/21 0634  NA 140 141  K 3.8 3.8  CL 110 111  CO2 24 24  GLUCOSE 89 86  BUN 15 18  CREATININE 0.85 0.80  CALCIUM 9.4 9.3   Recent Labs    12/04/21 2358  INR 1.1   Urinalysis    Component Value Date/Time   COLORURINE RED (A) 12/04/2021 1547   COLORURINE RED (A) 12/04/2021 1547   APPEARANCEUR CLOUDY (A) 12/04/2021 1547   APPEARANCEUR CLOUDY (A) 12/04/2021 1547   LABSPEC 1.022 12/04/2021 1547   LABSPEC 1.022 12/04/2021 1547   PHURINE  12/04/2021 1547    TEST NOT REPORTED DUE TO COLOR INTERFERENCE OF URINE PIGMENT   PHURINE  12/04/2021 1547    TEST NOT REPORTED DUE TO COLOR INTERFERENCE OF URINE PIGMENT   GLUCOSEU (A) 12/04/2021 1547    TEST NOT REPORTED DUE TO COLOR INTERFERENCE OF URINE PIGMENT   GLUCOSEU (A) 12/04/2021 1547    TEST NOT REPORTED DUE TO COLOR INTERFERENCE OF URINE PIGMENT   HGBUR (A) 12/04/2021 1547    TEST NOT REPORTED DUE TO COLOR INTERFERENCE OF URINE PIGMENT   HGBUR (A) 12/04/2021 1547    TEST NOT REPORTED DUE TO COLOR INTERFERENCE OF URINE PIGMENT   BILIRUBINUR (A) 12/04/2021 1547    TEST NOT REPORTED DUE TO COLOR INTERFERENCE OF URINE PIGMENT   BILIRUBINUR (A) 12/04/2021 1547    TEST NOT REPORTED DUE TO COLOR INTERFERENCE OF URINE PIGMENT   KETONESUR (A) 12/04/2021 1547    TEST NOT REPORTED DUE TO COLOR INTERFERENCE OF URINE PIGMENT   KETONESUR (A) 12/04/2021 1547    TEST NOT REPORTED DUE TO COLOR INTERFERENCE OF URINE PIGMENT   PROTEINUR (A) 12/04/2021 1547    TEST NOT REPORTED DUE TO COLOR INTERFERENCE OF URINE PIGMENT   PROTEINUR (A) 12/04/2021 1547    TEST NOT REPORTED DUE TO COLOR INTERFERENCE OF URINE PIGMENT   UROBILINOGEN 1.0 02/11/2015 1546   NITRITE (A) 12/04/2021 1547    TEST NOT REPORTED DUE TO COLOR INTERFERENCE OF URINE PIGMENT    NITRITE (A) 12/04/2021 1547    TEST NOT REPORTED DUE TO COLOR INTERFERENCE OF URINE PIGMENT   LEUKOCYTESUR (A) 12/04/2021 1547    TEST NOT REPORTED DUE TO COLOR INTERFERENCE OF URINE PIGMENT   LEUKOCYTESUR (A) 12/04/2021 1547    TEST NOT REPORTED DUE TO COLOR INTERFERENCE OF URINE PIGMENT   Radiologic Imaging: CT ABDOMEN PELVIS WO CONTRAST  Result Date: 12/04/2021 CLINICAL DATA:  Pelvic pain. Negative beta HCG. Hematuria. Epigastric pain. Hematuria and hematemesis for 3 weeks. Patient has been treated with antibiotics for kidney infection for 2 weeks. Syncopal episodes for 3 weeks. EXAM: CT ABDOMEN AND PELVIS WITHOUT CONTRAST TECHNIQUE: Multidetector CT imaging of the abdomen and pelvis was performed following the standard protocol without IV contrast. RADIATION DOSE REDUCTION: This exam was  performed according to the departmental dose-optimization program which includes automated exposure control, adjustment of the mA and/or kV according to patient size and/or use of iterative reconstruction technique. COMPARISON:  08/26/2016 FINDINGS: Lower chest: Lung bases are clear. Hepatobiliary: Layering density in the gallbladder consistent with small stones or milk of calcium. No gallbladder wall thickening or infiltration. No focal liver lesions. Pancreas: Unremarkable. No pancreatic ductal dilatation or surrounding inflammatory changes. Spleen: Normal in size without focal abnormality. Adrenals/Urinary Tract: Adrenal glands are unremarkable. Kidneys are normal, without renal calculi, focal lesion, or hydronephrosis. Bladder is decompressed, limiting evaluation but no focal lesions or stones are identified. Stomach/Bowel: Stomach is within normal limits. Appendix appears normal. No evidence of bowel wall thickening, distention, or inflammatory changes. Vascular/Lymphatic: No significant vascular findings are present. No enlarged abdominal or pelvic lymph nodes. Reproductive: Uterus and bilateral adnexa are  unremarkable. Other: No abdominal wall hernia or abnormality. No abdominopelvic ascites. Musculoskeletal: No acute or significant osseous findings. IMPRESSION: 1. Cholelithiasis with small stones versus milk of calcium layering in the gallbladder. No inflammatory changes appreciated. 2. No evidence of bowel obstruction or inflammation. 3. No renal or ureteral stone or obstruction. Electronically Signed   By: Burman Nieves M.D.   On: 12/04/2021 18:11    Assessment & Plan:  31 year old female recently admitted at [facility] with hemorrhagic cystitis and pyelonephritis due to multidrug-resistant E. coli now being readmitted with persistent hematuria and acute blood loss anemia, s/p 1 unit RBCs, with grossly positive stool guaiac in the setting of extensive Goody's powder use.  Admission UA was rather reassuring for persistent infection, possibly due to recent antibiotic use.  Agree with empiric antibiotics given her reports of occasional dysuria.   Ultimately, she will require outpatient hematuria work-up including CT urogram and cystoscopy.  She prefers to follow-up with [place] rather than [facility] urology given that she lives locally in [place].  We will arrange.  Recommend deferring Foley catheter placement unless she becomes unable to pass clots or has an elevated bladder residual.  Recommendations: -Continue empiric antibiotics and follow urine culture -Outpatient CT urogram and cystoscopy -Agree with discontinuing Goody's powder and GI consult given grossly positive stool guaiac  Thank you for involving me in this patient's care, I will continue to follow along.  Carman Ching, PA-C 12/05/2021 10:43 AM

## 2021-12-05 NOTE — ED Notes (Signed)
Pain meds given.    Pt watching tv.  

## 2021-12-05 NOTE — Progress Notes (Signed)
PHARMACY NOTE:  ANTIMICROBIAL RENAL DOSAGE ADJUSTMENT  Current antimicrobial regimen includes a mismatch between antimicrobial dosage and estimated renal function.  As per policy approved by the Pharmacy & Therapeutics and Medical Executive Committees, the antimicrobial dosage will be adjusted accordingly.  Current antimicrobial dosage:  cefepime 2 grams IV every 12 hours  Indication: hemorrhagic cystitis  Renal Function:  Estimated Creatinine Clearance: 93.4 mL/min (by C-G formula based on SCr of 0.8 mg/dL). []      On intermittent HD, scheduled: []      On CRRT    Antimicrobial dosage has been changed to:  cefepime 2 grams IV every 8 hours   Thank you for allowing pharmacy to be a part of this patient's care.  , [facility] 12/05/2021 2:47 PM

## 2021-12-05 NOTE — Consult Note (Signed)
Urology Consult  I have been asked to see the patient by Dr. Allena Katz, for evaluation and management of gross hematuria.  Chief Complaint: Hematuria, hematemesis, vaginal bleeding  History of Present Illness: Crystal Short is a 31 y.o. year old female with PMH syphilis and trichomonas who presented to the ED yesterday with reports of 3 weeks of hematuria and hematemesis.  She was admitted at [facility] from 11/22/2021 to 11/27/2021 with pyelonephritis and gross hematuria. Urology was consulted, who diagnosed hemorrhagic cystitis.  Her urine culture grew multidrug-resistant E. coli and she was treated with IV Rocephin and discharged on p.o. Cipro.  CTAP with contrast yesterday with punctate nonobstructing left renal stones, but no hydronephrosis, perivesical or perinephric stranding.  There appears to be something within the bladder lumen, incompletely characterized on exam, possibly representing clot material.  Admission labs notable for UA with >50 RBCs/hpf without pyuria or bacteriuria.  Urine sample appears contained with 21-50 squamous epithelial cells/LPF.  Urine cultures pending, on antibiotics as below.  Hemoglobin is stable today at 8.7, previously 12.1 2 weeks ago.  She received 1 unit of RBCs this morning.  Today she reports bilateral back pain and intermittent dysuria.  She states that her gross hematuria improved slightly on outpatient Cipro, but it worsened again after completion of the antibiotic.  Her urine is described as dark red and she has been passing clots.  When her gross hematuria began earlier this month, she inserted a tampon because she thought it represented menses but the tampon did not capture any bleeding and she continued to have gross hematuria.  Notably, she mentions persistent headache for the past month for which she has been taking 1+ Goody's powder daily.  She reports several syncopal episodes in the past 2 weeks.  LMP >1 month ago.  Anti-infectives  (From admission, onward)    Start     Dose/Rate Route Frequency Ordered Stop   12/05/21 0000  ceFEPIme (MAXIPIME) 2 g in sodium chloride 0.9 % 100 mL IVPB        2 g 200 mL/hr over 30 Minutes Intravenous Every 12 hours 12/04/21 2328         Past Medical History:  Diagnosis Date   Syphilis 09/15/2016   reactive RPR 1:8   Trichomonas infection     Past Surgical History:  Procedure Laterality Date   NO PAST SURGERIES      Home Medications:  No outpatient medications have been marked as taking for the 12/04/21 encounter [facility] Encounter).    Allergies:  Allergies  Allergen Reactions   Orange Fruit [Citrus] Swelling    Swelling is of the tongue.    Family History  Problem Relation Age of Onset   Diabetes Mother    Lung disease Mother    Lung cancer Mother    Diabetes Brother    Diabetes Maternal Grandmother    Sickle cell trait Father    Sickle cell trait Paternal Grandmother    Congestive Heart Failure Paternal Grandfather    Sickle cell anemia Half-Sister     Social History:  reports that she has been smoking e-cigarettes and cigars. She has never used smokeless tobacco. She reports current alcohol use of about 8.0 standard drinks of alcohol per week. She reports current drug use. Drug: Marijuana.  ROS: A complete review of systems was performed.  All systems are negative except for pertinent findings as noted.  Physical Exam:  Vital signs in last 24 hours: Temp:  [97.4 F (36.3 C)-98.4  F (36.9 C)] 98.1 F (36.7 C) (07/24 0949) Pulse Rate:  [50-94] 59 (07/24 0949) Resp:  [16-20] 16 (07/24 0949) BP: (95-133)/(56-89) 95/56 (07/24 0949) SpO2:  [100 %] 100 % (07/24 0949) Constitutional:  Alert and oriented, no acute distress HEENT: [place] AT, moist mucus membranes Cardiovascular: No clubbing, cyanosis, or edema Respiratory: Normal respiratory effort Skin: No rashes, bruises or suspicious lesions Neurologic: Grossly intact, no focal deficits, moving all 4  extremities Psychiatric: Normal mood and affect  Laboratory Data:  Recent Labs    12/04/21 1547 12/05/21 0634  WBC 8.1 7.2  HGB 8.5* 8.7*  HCT 26.6* 26.2*   Recent Labs    12/04/21 1547 12/05/21 0634  NA 140 141  K 3.8 3.8  CL 110 111  CO2 24 24  GLUCOSE 89 86  BUN 15 18  CREATININE 0.85 0.80  CALCIUM 9.4 9.3   Recent Labs    12/04/21 2358  INR 1.1   Urinalysis    Component Value Date/Time   COLORURINE RED (A) 12/04/2021 1547   COLORURINE RED (A) 12/04/2021 1547   APPEARANCEUR CLOUDY (A) 12/04/2021 1547   APPEARANCEUR CLOUDY (A) 12/04/2021 1547   LABSPEC 1.022 12/04/2021 1547   LABSPEC 1.022 12/04/2021 1547   PHURINE  12/04/2021 1547    TEST NOT REPORTED DUE TO COLOR INTERFERENCE OF URINE PIGMENT   PHURINE  12/04/2021 1547    TEST NOT REPORTED DUE TO COLOR INTERFERENCE OF URINE PIGMENT   GLUCOSEU (A) 12/04/2021 1547    TEST NOT REPORTED DUE TO COLOR INTERFERENCE OF URINE PIGMENT   GLUCOSEU (A) 12/04/2021 1547    TEST NOT REPORTED DUE TO COLOR INTERFERENCE OF URINE PIGMENT   HGBUR (A) 12/04/2021 1547    TEST NOT REPORTED DUE TO COLOR INTERFERENCE OF URINE PIGMENT   HGBUR (A) 12/04/2021 1547    TEST NOT REPORTED DUE TO COLOR INTERFERENCE OF URINE PIGMENT   BILIRUBINUR (A) 12/04/2021 1547    TEST NOT REPORTED DUE TO COLOR INTERFERENCE OF URINE PIGMENT   BILIRUBINUR (A) 12/04/2021 1547    TEST NOT REPORTED DUE TO COLOR INTERFERENCE OF URINE PIGMENT   KETONESUR (A) 12/04/2021 1547    TEST NOT REPORTED DUE TO COLOR INTERFERENCE OF URINE PIGMENT   KETONESUR (A) 12/04/2021 1547    TEST NOT REPORTED DUE TO COLOR INTERFERENCE OF URINE PIGMENT   PROTEINUR (A) 12/04/2021 1547    TEST NOT REPORTED DUE TO COLOR INTERFERENCE OF URINE PIGMENT   PROTEINUR (A) 12/04/2021 1547    TEST NOT REPORTED DUE TO COLOR INTERFERENCE OF URINE PIGMENT   UROBILINOGEN 1.0 02/11/2015 1546   NITRITE (A) 12/04/2021 1547    TEST NOT REPORTED DUE TO COLOR INTERFERENCE OF URINE PIGMENT    NITRITE (A) 12/04/2021 1547    TEST NOT REPORTED DUE TO COLOR INTERFERENCE OF URINE PIGMENT   LEUKOCYTESUR (A) 12/04/2021 1547    TEST NOT REPORTED DUE TO COLOR INTERFERENCE OF URINE PIGMENT   LEUKOCYTESUR (A) 12/04/2021 1547    TEST NOT REPORTED DUE TO COLOR INTERFERENCE OF URINE PIGMENT   Radiologic Imaging: CT ABDOMEN PELVIS WO CONTRAST  Result Date: 12/04/2021 CLINICAL DATA:  Pelvic pain. Negative beta HCG. Hematuria. Epigastric pain. Hematuria and hematemesis for 3 weeks. Patient has been treated with antibiotics for kidney infection for 2 weeks. Syncopal episodes for 3 weeks. EXAM: CT ABDOMEN AND PELVIS WITHOUT CONTRAST TECHNIQUE: Multidetector CT imaging of the abdomen and pelvis was performed following the standard protocol without IV contrast. RADIATION DOSE REDUCTION: This exam was  performed according to the departmental dose-optimization program which includes automated exposure control, adjustment of the mA and/or kV according to patient size and/or use of iterative reconstruction technique. COMPARISON:  08/26/2016 FINDINGS: Lower chest: Lung bases are clear. Hepatobiliary: Layering density in the gallbladder consistent with small stones or milk of calcium. No gallbladder wall thickening or infiltration. No focal liver lesions. Pancreas: Unremarkable. No pancreatic ductal dilatation or surrounding inflammatory changes. Spleen: Normal in size without focal abnormality. Adrenals/Urinary Tract: Adrenal glands are unremarkable. Kidneys are normal, without renal calculi, focal lesion, or hydronephrosis. Bladder is decompressed, limiting evaluation but no focal lesions or stones are identified. Stomach/Bowel: Stomach is within normal limits. Appendix appears normal. No evidence of bowel wall thickening, distention, or inflammatory changes. Vascular/Lymphatic: No significant vascular findings are present. No enlarged abdominal or pelvic lymph nodes. Reproductive: Uterus and bilateral adnexa are  unremarkable. Other: No abdominal wall hernia or abnormality. No abdominopelvic ascites. Musculoskeletal: No acute or significant osseous findings. IMPRESSION: 1. Cholelithiasis with small stones versus milk of calcium layering in the gallbladder. No inflammatory changes appreciated. 2. No evidence of bowel obstruction or inflammation. 3. No renal or ureteral stone or obstruction. Electronically Signed   By: William  Stevens M.D.   On: 12/04/2021 18:11    Assessment & Plan:  31-year-old female recently admitted at [facility] with hemorrhagic cystitis and pyelonephritis due to multidrug-resistant E. coli now being readmitted with persistent hematuria and acute blood loss anemia, s/p 1 unit RBCs, with grossly positive stool guaiac in the setting of extensive Goody's powder use.  Admission UA was rather reassuring for persistent infection, possibly due to recent antibiotic use.  Agree with empiric antibiotics given her reports of occasional dysuria.   Ultimately, she will require outpatient hematuria work-up including CT urogram and cystoscopy.  She prefers to follow-up with us rather than [facility] urology given that she lives locally in [place].  We will arrange.  Recommend deferring Foley catheter placement unless she becomes unable to pass clots or has an elevated bladder residual.  Recommendations: -Continue empiric antibiotics and follow urine culture -Outpatient CT urogram and cystoscopy -Agree with discontinuing Goody's powder and GI consult given grossly positive stool guaiac  Thank you for involving me in this patient's care, I will continue to follow along.  Abbe Bula, PA-C 12/05/2021 10:43 AM   

## 2021-12-05 NOTE — Assessment & Plan Note (Signed)
persistent recurrent headaches will get MRI.

## 2021-12-05 NOTE — ED Notes (Signed)
Resumed care from dejae rn.  Pt alert  iv meds infusing.  Pt waiting on admission bed.

## 2021-12-05 NOTE — Assessment & Plan Note (Signed)
We will get urology consult. Cont iv abx.

## 2021-12-05 NOTE — ED Notes (Signed)
Report messaged to berna m rn  Occupational psychologist.

## 2021-12-05 NOTE — ED Notes (Signed)
Patient in MRI at this time. 

## 2021-12-05 NOTE — Progress Notes (Addendum)
Progress Note    Crystal Short  ENI:[id number] DOB: [birth year]  DOA: 12/04/2021 PCP: Patient, No Pcp Per      Brief Narrative:    Medical records reviewed and are as summarized below:  Crystal Short is a 31 y.o. female with medical history significant for syphilis, trichomoniasis infection, recent E. coli UTI/hemorrhagic cystitis pyelonephritis (initially treated with Rocephin and discharged on ciprofloxacin, completed antibiotics on 12/02/2021).  She presented to the hospital with complaints of fever, dysuria pain on both sides of her lower back, hematuria, dizziness, headache, nausea and bloody emesis.  She was admitted to the hospital for probable acute and gross hematuria leading to acute blood loss anemia.  There was also concern for hematemesis.    Assessment/Plan:   Principal Problem:   Gross hematuria Active Problems:   Anemia   Persistent headaches   Dizziness   GIB (gastrointestinal bleeding)   Hematemesis   Gross hematuria, probable acute UTI, recent diagnosis of E. coli pyelonephritis/hemorrhagic cystitis: Continue IV cefepime.  Follow-up urine culture.  Appreciate input from urologist.  Acute blood loss anemia, iron deficiency anemia: S/p transfusion with 1 unit of PRBCs on 12/04/2021.  Monitor H&H.  Hemoglobin on admission was 8.5.  Hemoglobin was 12.1 on 11/22/2021.     Latest Reference Range & Units 12/04/21 15:47  Iron 28 - 170 ug/dL <5 (L)  UIBC ug/dL NOT CALCULATED  TIBC 614 - 450 ug/dL 431  Saturation Ratios 10.4 - 31.8 % NOT CALCULATED  Ferritin 11 - 307 ng/mL 12  Folate >5.9 ng/mL 13.1  (L): Data is abnormally low   Hematemesis: Continue IV Protonix.  Follow-up with GI  Headache: MRI brain did not show any acute abnormality.   Diet Order             Diet NPO time specified Except for: Sips with Meds  Diet effective now                        Consultants: Urologist,  gastroenterologist  Procedures: None    Medications:    [START ON 12/08/2021] pantoprazole  40 mg Intravenous Q12H   sodium chloride flush  3 mL Intravenous Q12H   Continuous Infusions:  sodium chloride Stopped (12/05/21 0035)   sodium chloride Stopped (12/05/21 0035)   ceFEPime (MAXIPIME) IV     pantoprazole 8 mg/hr (12/05/21 1450)     Anti-infectives (From admission, onward)    Start     Dose/Rate Route Frequency Ordered Stop   12/05/21 1800  ceFEPIme (MAXIPIME) 2 g in sodium chloride 0.9 % 100 mL IVPB        2 g 200 mL/hr over 30 Minutes Intravenous Every 8 hours 12/05/21 1448     12/05/21 0000  ceFEPIme (MAXIPIME) 2 g in sodium chloride 0.9 % 100 mL IVPB  Status:  Discontinued        2 g 200 mL/hr over 30 Minutes Intravenous Every 12 hours 12/04/21 2328 12/05/21 1448              Family Communication/Anticipated D/C date and plan/Code Status   DVT prophylaxis: Place and maintain sequential compression device Start: 12/05/21 0106 SCDs Start: 12/05/21 0015     Code Status: Full Code  Family Communication: None Disposition Plan: Plan to discharge home in 1 to 2 days   Status is: Inpatient Remains inpatient appropriate because: Gross hematuria and anemia       Subjective:   Interval events noted.  She  complains of generalized weakness.  She said she had bloody urine.  She could not tell whether she actually had rectal bleeding or not because blood and the stools were together in the toilet bowl.  She said she had been vomiting blood.  No bleeding today.  Objective:    Vitals:   12/05/21 0500 12/05/21 0556 12/05/21 0949 12/05/21 1522  BP: 113/82 95/60 (!) 95/56 117/74  Pulse:  94 (!) 59 71  Resp: 20 20 16 20   Temp:  (!) 97.4 F (36.3 C) 98.1 F (36.7 C) 98 F (36.7 C)  TempSrc:  Oral Oral Oral  SpO2: 100% 100% 100% 99%   No data found.   Intake/Output Summary (Last 24 hours) at 12/05/2021 1740 Last data filed at 12/05/2021 0543 Gross per  24 hour  Intake 546.6 ml  Output --  Net 546.6 ml   There were no vitals filed for this visit.  Exam:  GEN: NAD SKIN: Warm and dry EYES: No pallor or icterus ENT: MMM CV: RRR PULM: CTA B ABD: soft, ND, NT, +BS CNS: AAO x 3, non focal EXT: No edema or tenderness        Data Reviewed:   I have personally reviewed following labs and imaging studies:  Labs: Labs show the following:   Basic Metabolic Panel: Recent Labs  Lab 12/04/21 1547 12/05/21 0634  NA 140 141  K 3.8 3.8  CL 110 111  CO2 24 24  GLUCOSE 89 86  BUN 15 18  CREATININE 0.85 0.80  CALCIUM 9.4 9.3   GFR CrCl cannot be calculated (Unknown ideal weight.). Liver Function Tests: Recent Labs  Lab 12/04/21 1547 12/05/21 0634  AST 18 13*  ALT 14 11  ALKPHOS 49 39  BILITOT 0.6 1.1  PROT 7.5 6.7  ALBUMIN 4.2 3.8   No results for input(s): "LIPASE", "AMYLASE" in the last 168 hours. No results for input(s): "AMMONIA" in the last 168 hours. Coagulation profile Recent Labs  Lab 12/04/21 2358  INR 1.1    CBC: Recent Labs  Lab 12/04/21 1547 12/05/21 0634  WBC 8.1 7.2  HGB 8.5* 8.7*  HCT 26.6* 26.2*  MCV 79.2* 81.6  PLT 685* 539*   Cardiac Enzymes: No results for input(s): "CKTOTAL", "CKMB", "CKMBINDEX", "TROPONINI" in the last 168 hours. BNP (last 3 results) No results for input(s): "PROBNP" in the last 8760 hours. CBG: No results for input(s): "GLUCAP" in the last 168 hours. D-Dimer: No results for input(s): "DDIMER" in the last 72 hours. Hgb A1c: No results for input(s): "HGBA1C" in the last 72 hours. Lipid Profile: No results for input(s): "CHOL", "HDL", "LDLCALC", "TRIG", "CHOLHDL", "LDLDIRECT" in the last 72 hours. Thyroid function studies: Recent Labs    12/04/21 1547  TSH 1.447   Anemia work up: Recent Labs    12/04/21 1547 12/05/21 0634  VITAMINB12  --  395  FOLATE 13.1  --   FERRITIN 12  --   TIBC 414  --   IRON <5*  --   RETICCTPCT  --  3.0   Sepsis  Labs: Recent Labs  Lab 12/04/21 1547 12/05/21 0634  WBC 8.1 7.2    Microbiology No results found for this or any previous visit (from the past 240 hour(s)).  Procedures and diagnostic studies:  MR BRAIN WO CONTRAST  Result Date: 12/05/2021 CLINICAL DATA:  Headache and blurry vision EXAM: MRI HEAD WITHOUT CONTRAST TECHNIQUE: Multiplanar, multiecho pulse sequences of the brain and surrounding structures were obtained without intravenous contrast. COMPARISON:  None Available. FINDINGS: Brain: No acute infarct, mass effect or extra-axial collection. No acute or chronic hemorrhage. Normal white matter signal, parenchymal volume and CSF spaces. The midline structures are normal. Vascular: Major flow voids are preserved. Skull and upper cervical spine: Normal calvarium and skull base. Visualized upper cervical spine and soft tissues are normal. Sinuses/Orbits:No paranasal sinus fluid levels or advanced mucosal thickening. No mastoid or middle ear effusion. Normal orbits. IMPRESSION: Normal brain MRI. Electronically Signed   By: Ulyses Jarred M.D.   On: 12/05/2021 01:40   US Renal  Result Date: 12/04/2021 CLINICAL DATA:  Gross hematuria. EXAM: RENAL / URINARY TRACT ULTRASOUND COMPLETE COMPARISON:  None Available. FINDINGS: Right Kidney: Renal measurements: 9.8 cm x 3.2 cm x 4.2 cm = volume: 70.2 mL. Echogenicity within normal limits. No mass or hydronephrosis visualized. Left Kidney: Renal measurements: 10.0 cm x 5.6 cm x 4.3 cm = volume: 123 mL. Echogenicity within normal limits. No mass or hydronephrosis visualized. Bladder: Poorly distended and subsequently limited in evaluation. Other: None. IMPRESSION: Limited visualization of the urinary bladder, as described above, with normal ultrasonographic appearance of kidneys. Electronically Signed   By: Virgina Norfolk M.D.   On: 12/04/2021 23:50   CT ABDOMEN PELVIS WO CONTRAST  Result Date: 12/04/2021 CLINICAL DATA:  Pelvic pain. Negative beta HCG.  Hematuria. Epigastric pain. Hematuria and hematemesis for 3 weeks. Patient has been treated with antibiotics for kidney infection for 2 weeks. Syncopal episodes for 3 weeks. EXAM: CT ABDOMEN AND PELVIS WITHOUT CONTRAST TECHNIQUE: Multidetector CT imaging of the abdomen and pelvis was performed following the standard protocol without IV contrast. RADIATION DOSE REDUCTION: This exam was performed according to the departmental dose-optimization program which includes automated exposure control, adjustment of the mA and/or kV according to patient size and/or use of iterative reconstruction technique. COMPARISON:  08/26/2016 FINDINGS: Lower chest: Lung bases are clear. Hepatobiliary: Layering density in the gallbladder consistent with small stones or milk of calcium. No gallbladder wall thickening or infiltration. No focal liver lesions. Pancreas: Unremarkable. No pancreatic ductal dilatation or surrounding inflammatory changes. Spleen: Normal in size without focal abnormality. Adrenals/Urinary Tract: Adrenal glands are unremarkable. Kidneys are normal, without renal calculi, focal lesion, or hydronephrosis. Bladder is decompressed, limiting evaluation but no focal lesions or stones are identified. Stomach/Bowel: Stomach is within normal limits. Appendix appears normal. No evidence of bowel wall thickening, distention, or inflammatory changes. Vascular/Lymphatic: No significant vascular findings are present. No enlarged abdominal or pelvic lymph nodes. Reproductive: Uterus and bilateral adnexa are unremarkable. Other: No abdominal wall hernia or abnormality. No abdominopelvic ascites. Musculoskeletal: No acute or significant osseous findings. IMPRESSION: 1. Cholelithiasis with small stones versus milk of calcium layering in the gallbladder. No inflammatory changes appreciated. 2. No evidence of bowel obstruction or inflammation. 3. No renal or ureteral stone or obstruction. Electronically Signed   By: Lucienne Capers  M.D.   On: 12/04/2021 18:11               LOS: 0 days   Crystal Short  [facility]   Pager on [email or web address]. If 7PM-7AM, please contact night-coverage at [email or web address]     12/05/2021, 5:40 PM

## 2021-12-06 DIAGNOSIS — K920 Hematemesis: Secondary | ICD-10-CM | POA: Diagnosis not present

## 2021-12-06 DIAGNOSIS — D649 Anemia, unspecified: Secondary | ICD-10-CM

## 2021-12-06 DIAGNOSIS — R310 Gross hematuria: Secondary | ICD-10-CM | POA: Diagnosis not present

## 2021-12-06 LAB — CBC
HCT: 23 % — ABNORMAL LOW (ref 36.0–46.0)
Hemoglobin: 7.8 g/dL — ABNORMAL LOW (ref 12.0–15.0)
MCH: 27.1 pg (ref 26.0–34.0)
MCHC: 33.9 g/dL (ref 30.0–36.0)
MCV: 79.9 fL — ABNORMAL LOW (ref 80.0–100.0)
Platelets: 488 10*3/uL — ABNORMAL HIGH (ref 150–400)
RBC: 2.88 MIL/uL — ABNORMAL LOW (ref 3.87–5.11)
RDW: 15.9 % — ABNORMAL HIGH (ref 11.5–15.5)
WBC: 6 10*3/uL (ref 4.0–10.5)
nRBC: 0 % (ref 0.0–0.2)

## 2021-12-06 LAB — URINE CULTURE: Culture: NO GROWTH

## 2021-12-06 LAB — PREPARE RBC (CROSSMATCH)

## 2021-12-06 MED ORDER — SODIUM CHLORIDE 0.9% IV SOLUTION: Freq: Once | INTRAVENOUS | Status: DC

## 2021-12-06 NOTE — TOC CM/SW Note (Signed)
  Transition of Care (TOC) Screening Note   Patient Details  Name: Crystal Short Date of Birth: January 23, 1991   Transition of Care [facility]) CM/SW Contact:    Margarito Liner, LCSW Phone Number: 12/06/2021, 2:34 PM    Transition of Care Department [facility]) has reviewed patient and no TOC needs have been identified at this time. We will continue to monitor patient advancement through interdisciplinary progression rounds. If new patient transition needs arise, please place a TOC consult.

## 2021-12-06 NOTE — Care Plan (Signed)
Brief GI Note Discussed case with Dr. Allena Katz on 12/05/21 evening. Pt presented with hematuria and goody powder use and anemia. Pt with brown stool on rectal exam per Dr. Allena Katz. Reports guiac positive. Stool occult blood test has no role in evaluation of anemia and is a test for colon cancer screening and is inappropriate to be used for evaluation of anemia/gastrointestinal bleeding, as a negative or positive test would not change evaluation.  I personally did not say the guiac testing was positive because of her hematuria, this is inaccurately reflected in the primary team's note.  Due to the stool being brown on DRE and no signs of gib - just a "report" of hematemesis and positive guiac - I recommended the primary team workup the already known source of bleeding (the hematuria) prior to GI involvement. Vital signs have been stable and  BUN/Cr ratio are not elevated which would be an additional indication if the patient were having an upper gi bleed. Urology has already seen the patient and also noted clots in their note.  I agree with discontinuation of goodys and can use ppi if concern for gastritis. Should patient develop GI bleeding, we will be available for consult if needed. Previous consult had been discontinued yesterday  Enis Slipper, DO [facility]

## 2021-12-06 NOTE — Progress Notes (Addendum)
Progress Note    Crystal Short  UKG:[id number] DOB: Jun 26, 1990  DOA: 12/04/2021 PCP: Patient, No Pcp Per      Brief Narrative:    Medical records reviewed and are as summarized below:  Crystal Short is a 31 y.o. female with medical history significant for syphilis, trichomoniasis infection, recent E. coli UTI/hemorrhagic cystitis pyelonephritis (initially treated with Rocephin and discharged on ciprofloxacin, completed antibiotics on 12/02/2021).  She presented to the hospital with complaints of fever, dysuria pain on both sides of her lower back, hematuria, dizziness, headache, nausea and bloody emesis.  She was admitted to the hospital for probable acute and gross hematuria leading to acute blood loss anemia.  There was also concern for hematemesis.    Assessment/Plan:   Principal Problem:   Gross hematuria Active Problems:   Symptomatic anemia   Persistent headaches   Dizziness   GIB (gastrointestinal bleeding)   Hematemesis   Gross hematuria, probable acute UTI, recent diagnosis of E. coli pyelonephritis/hemorrhagic cystitis: No growth on urine culture.  Continue IV cefepime for now.  She was evaluated by the urologist who recommended continuing antibiotics and outpatient follow-up for CT urogram and cystoscopy.    Acute blood loss anemia, iron deficiency anemia: Transfuse 1 unit of PRBCs today because of ongoing hematuria, drop in hemoglobin from 8.7-7.8 associated with dizziness and fatigue.  S/p transfusion with 1 unit of PRBCs on 12/04/2021.  Monitor H&H.  Hemoglobin on admission was 8.5.  Hemoglobin was 12.1 on 11/22/2021.     Latest Reference Range & Units 12/04/21 15:47  Iron 28 - 170 ug/dL <5 (L)  UIBC ug/dL NOT CALCULATED  TIBC 762 - 450 ug/dL 831  Saturation Ratios 10.4 - 31.8 % NOT CALCULATED  Ferritin 11 - 307 ng/mL 12  Folate >5.9 ng/mL 13.1  (L): Data is abnormally low   Hematemesis, positive heme stools: Resolved. Dr. Allena Katz had consulted  Dr.Russo, gastroenterologist.  Dr. Timothy Lasso did not recommend inpatient evaluation at this time because he thinks positive heme stools is from hematuria.Crystal Short  Headache: MRI brain did not show any acute abnormality.   Diet Order             Diet clear liquid Room service appropriate? Yes; Fluid consistency: Thin  Diet effective now                        Consultants: Urologist  Procedures: None    Medications:    sodium chloride   Intravenous Once   [START ON 12/08/2021] pantoprazole  40 mg Intravenous Q12H   sodium chloride flush  3 mL Intravenous Q12H   Continuous Infusions:  sodium chloride Stopped (12/05/21 0035)   sodium chloride 20 mL/hr at 12/05/21 1827   ceFEPime (MAXIPIME) IV 2 g (12/06/21 1311)   pantoprazole 8 mg/hr (12/06/21 0605)     Anti-infectives (From admission, onward)    Start     Dose/Rate Route Frequency Ordered Stop   12/05/21 1800  ceFEPIme (MAXIPIME) 2 g in sodium chloride 0.9 % 100 mL IVPB        2 g 200 mL/hr over 30 Minutes Intravenous Every 8 hours 12/05/21 1448     12/05/21 0000  ceFEPIme (MAXIPIME) 2 g in sodium chloride 0.9 % 100 mL IVPB  Status:  Discontinued        2 g 200 mL/hr over 30 Minutes Intravenous Every 12 hours 12/04/21 2328 12/05/21 1448  Family Communication/Anticipated D/C date and plan/Code Status   DVT prophylaxis: Place and maintain sequential compression device Start: 12/05/21 0106 SCDs Start: 12/05/21 0015     Code Status: Full Code  Family Communication: Plan of care was discussed with her sister on speaker phone in patient's room. Disposition Plan: Plan to discharge home in 1 to 2 days   Status is: Inpatient Remains inpatient appropriate because: Gross hematuria and anemia       Subjective:   She complains of bloody urine, fatigue and dizziness.  She said her blood pressure initially was low and has been like that since she got pregnant about 2 years ago.  She could not keep  food down yesterday and threw up once but it was no blood in the vomitus.  Objective:    Vitals:   12/05/21 1831 12/05/21 2301 12/06/21 0528 12/06/21 0801  BP: (!) 130/94 93/68 104/67 91/61  Pulse: (!) 49 (!) 52 68 100  Resp: 18 20 18 18   Temp: 97.8 F (36.6 C) 98.5 F (36.9 C) 97.9 F (36.6 C) 97.8 F (36.6 C)  TempSrc: Oral     SpO2: 100% 100% 100% 100%   No data found.   Intake/Output Summary (Last 24 hours) at 12/06/2021 1350 Last data filed at 12/05/2021 2300 Gross per 24 hour  Intake 395.98 ml  Output 1 ml  Net 394.98 ml   There were no vitals filed for this visit.  Exam:  GEN: NAD SKIN: No rash EYES: EOMI ENT: MMM CV: RRR PULM: CTA B ABD: soft, ND, NT, +BS CNS: AAO x 3, non focal EXT: No edema or tenderness        Data Reviewed:   I have personally reviewed following labs and imaging studies:  Labs: Labs show the following:   Basic Metabolic Panel: Recent Labs  Lab 12/04/21 1547 12/05/21 0634  NA 140 141  K 3.8 3.8  CL 110 111  CO2 24 24  GLUCOSE 89 86  BUN 15 18  CREATININE 0.85 0.80  CALCIUM 9.4 9.3   GFR CrCl cannot be calculated (Unknown ideal weight.). Liver Function Tests: Recent Labs  Lab 12/04/21 1547 12/05/21 0634  AST 18 13*  ALT 14 11  ALKPHOS 49 39  BILITOT 0.6 1.1  PROT 7.5 6.7  ALBUMIN 4.2 3.8   No results for input(s): "LIPASE", "AMYLASE" in the last 168 hours. No results for input(s): "AMMONIA" in the last 168 hours. Coagulation profile Recent Labs  Lab 12/04/21 2358  INR 1.1    CBC: Recent Labs  Lab 12/04/21 1547 12/05/21 0634 12/06/21 0617  WBC 8.1 7.2 6.0  HGB 8.5* 8.7* 7.8*  HCT 26.6* 26.2* 23.0*  MCV 79.2* 81.6 79.9*  PLT 685* 539* 488*   Cardiac Enzymes: No results for input(s): "CKTOTAL", "CKMB", "CKMBINDEX", "TROPONINI" in the last 168 hours. BNP (last 3 results) No results for input(s): "PROBNP" in the last 8760 hours. CBG: No results for input(s): "GLUCAP" in the last 168  hours. D-Dimer: No results for input(s): "DDIMER" in the last 72 hours. Hgb A1c: No results for input(s): "HGBA1C" in the last 72 hours. Lipid Profile: No results for input(s): "CHOL", "HDL", "LDLCALC", "TRIG", "CHOLHDL", "LDLDIRECT" in the last 72 hours. Thyroid function studies: Recent Labs    12/04/21 1547  TSH 1.447   Anemia work up: Recent Labs    12/04/21 1547 12/05/21 0634  VITAMINB12  --  395  FOLATE 13.1  --   FERRITIN 12  --   TIBC 414  --  IRON <5*  --   RETICCTPCT  --  3.0   Sepsis Labs: Recent Labs  Lab 12/04/21 1547 12/05/21 0634 12/06/21 0617  WBC 8.1 7.2 6.0    Microbiology Recent Results (from the past 240 hour(s))  Urine Culture     Status: None   Collection Time: 12/04/21  3:47 PM   Specimen: Urine, Clean Catch  Result Value Ref Range Status   Specimen Description   Final    URINE, CLEAN CATCH Performed at [facility], [address] [place], [place] [address]    Special Requests   Final    NONE Performed at [facility], [address] [place], [place] [address]    Culture   Final    NO GROWTH Performed at [facility], [address] [place], [place] [address]    Report Status 12/06/2021 FINAL  Final    Procedures and diagnostic studies:  MR BRAIN WO CONTRAST  Result Date: 12/05/2021 CLINICAL DATA:  Headache and blurry vision EXAM: MRI HEAD WITHOUT CONTRAST TECHNIQUE: Multiplanar, multiecho pulse sequences of the brain and surrounding structures were obtained without intravenous contrast. COMPARISON:  None Available. FINDINGS: Brain: No acute infarct, mass effect or extra-axial collection. No acute or chronic hemorrhage. Normal white matter signal, parenchymal volume and CSF spaces. The midline structures are normal. Vascular: Major flow voids are preserved. Skull and upper cervical spine: Normal calvarium and skull base. Visualized upper cervical spine and soft tissues are normal. Sinuses/Orbits:No  paranasal sinus fluid levels or advanced mucosal thickening. No mastoid or middle ear effusion. Normal orbits. IMPRESSION: Normal brain MRI. Electronically Signed   By: Deatra Robinson M.D.   On: 12/05/2021 01:40   US Renal  Result Date: 12/04/2021 CLINICAL DATA:  Gross hematuria. EXAM: RENAL / URINARY TRACT ULTRASOUND COMPLETE COMPARISON:  None Available. FINDINGS: Right Kidney: Renal measurements: 9.8 cm x 3.2 cm x 4.2 cm = volume: 70.2 mL. Echogenicity within normal limits. No mass or hydronephrosis visualized. Left Kidney: Renal measurements: 10.0 cm x 5.6 cm x 4.3 cm = volume: 123 mL. Echogenicity within normal limits. No mass or hydronephrosis visualized. Bladder: Poorly distended and subsequently limited in evaluation. Other: None. IMPRESSION: Limited visualization of the urinary bladder, as described above, with normal ultrasonographic appearance of kidneys. Electronically Signed   By: Aram Candela M.D.   On: 12/04/2021 23:50   CT ABDOMEN PELVIS WO CONTRAST  Result Date: 12/04/2021 CLINICAL DATA:  Pelvic pain. Negative beta HCG. Hematuria. Epigastric pain. Hematuria and hematemesis for 3 weeks. Patient has been treated with antibiotics for kidney infection for 2 weeks. Syncopal episodes for 3 weeks. EXAM: CT ABDOMEN AND PELVIS WITHOUT CONTRAST TECHNIQUE: Multidetector CT imaging of the abdomen and pelvis was performed following the standard protocol without IV contrast. RADIATION DOSE REDUCTION: This exam was performed according to the departmental dose-optimization program which includes automated exposure control, adjustment of the mA and/or kV according to patient size and/or use of iterative reconstruction technique. COMPARISON:  08/26/2016 FINDINGS: Lower chest: Lung bases are clear. Hepatobiliary: Layering density in the gallbladder consistent with small stones or milk of calcium. No gallbladder wall thickening or infiltration. No focal liver lesions. Pancreas: Unremarkable. No pancreatic  ductal dilatation or surrounding inflammatory changes. Spleen: Normal in size without focal abnormality. Adrenals/Urinary Tract: Adrenal glands are unremarkable. Kidneys are normal, without renal calculi, focal lesion, or hydronephrosis. Bladder is decompressed, limiting evaluation but no focal lesions or stones are identified. Stomach/Bowel: Stomach is within normal limits. Appendix appears normal.  No evidence of bowel wall thickening, distention, or inflammatory changes. Vascular/Lymphatic: No significant vascular findings are present. No enlarged abdominal or pelvic lymph nodes. Reproductive: Uterus and bilateral adnexa are unremarkable. Other: No abdominal wall hernia or abnormality. No abdominopelvic ascites. Musculoskeletal: No acute or significant osseous findings. IMPRESSION: 1. Cholelithiasis with small stones versus milk of calcium layering in the gallbladder. No inflammatory changes appreciated. 2. No evidence of bowel obstruction or inflammation. 3. No renal or ureteral stone or obstruction. Electronically Signed   By: Lucienne Capers M.D.   On: 12/04/2021 18:11               LOS: 1 day   Ananias Kolander  [facility]   Pager on [email or web address]. If 7PM-7AM, please contact night-coverage at [email or web address]     12/06/2021, 1:50 PM

## 2021-12-06 NOTE — Plan of Care (Signed)
Pt AAOx4, mild back pain. BP is low, other VS are WNL. Plan for pain control and to monitor labs. Bed is in lowest position, call light within reach. Will continue to monitor.

## 2021-12-07 DIAGNOSIS — R310 Gross hematuria: Secondary | ICD-10-CM | POA: Diagnosis not present

## 2021-12-07 LAB — TYPE AND SCREEN
ABO/RH(D): B POS
Antibody Screen: NEGATIVE
Unit division: 0
Unit division: 0

## 2021-12-07 LAB — CBC
HCT: 27.1 % — ABNORMAL LOW (ref 36.0–46.0)
Hemoglobin: 9.1 g/dL — ABNORMAL LOW (ref 12.0–15.0)
MCH: 27.2 pg (ref 26.0–34.0)
MCHC: 33.6 g/dL (ref 30.0–36.0)
MCV: 81.1 fL (ref 80.0–100.0)
Platelets: 502 10*3/uL — ABNORMAL HIGH (ref 150–400)
RBC: 3.34 MIL/uL — ABNORMAL LOW (ref 3.87–5.11)
RDW: 15.9 % — ABNORMAL HIGH (ref 11.5–15.5)
WBC: 9.5 10*3/uL (ref 4.0–10.5)
nRBC: 0 % (ref 0.0–0.2)

## 2021-12-07 LAB — BPAM RBC
Blood Product Expiration Date: 202308072359
Blood Product Expiration Date: 202308192359
ISSUE DATE / TIME: 202307240250
ISSUE DATE / TIME: 202307251402
Unit Type and Rh: 7300
Unit Type and Rh: 7300

## 2021-12-07 MED ORDER — PANTOPRAZOLE SODIUM 40 MG PO TBEC
40.0000 mg | DELAYED_RELEASE_TABLET | Freq: Every day | ORAL | Status: DC
  Administered 2021-12-07 – 2021-12-12 (×5): 40 mg via ORAL
  Filled 2021-12-07 (×6): qty 1

## 2021-12-07 MED ORDER — TRAMADOL HCL 50 MG PO TABS
50.0000 mg | ORAL_TABLET | Freq: Four times a day (QID) | ORAL | Status: DC | PRN
  Administered 2021-12-07 – 2021-12-11 (×7): 50 mg via ORAL
  Filled 2021-12-07 (×7): qty 1

## 2021-12-07 NOTE — Progress Notes (Signed)
Pharmacy Antibiotic Note  Crystal Short is a 31 y.o. female admitted on 12/04/2021 with UTI.  Pharmacy has been consulted for cefepime dosing.  -recent [facility] admission 7/11-7/16/23 for pyelonephritis: UCX 7/12: E.coli,  was on Ceftriaxone then d/c on cipro -7/23 Ucx NG   Plan: Day 3-   Cefepime 2 gm IV q8h    Temp (24hrs), Avg:98.6 F (37 C), Min:98.2 F (36.8 C), Max:99.1 F (37.3 C)  Recent Labs  Lab 12/04/21 1547 12/05/21 0634 12/06/21 0617 12/07/21 0714  WBC 8.1 7.2 6.0 9.5  CREATININE 0.85 0.80  --   --      CrCl cannot be calculated (Unknown ideal weight.).    Allergies  Allergen Reactions   Orange Fruit [Citrus] Swelling    Swelling is of the tongue.    Antimicrobials this admission: cefepime 7/24 >>         Dose adjustments this admission:    Microbiology results:   BCx:   7/23 UCx: NG   Sputum:      MRSA PCR:     Thank you for allowing pharmacy to be a part of this patient's care.  Ashvik Grundman A 12/07/2021 11:38 AM

## 2021-12-07 NOTE — Progress Notes (Signed)
[facility]  - [place] at [facility]   PATIENT NAME: Crystal Short    MR#:  [id number]  DATE OF BIRTH:  [birth year]  SUBJECTIVE:  patient continues to have gross hematuria. Eyewitness did this morning. Complains of low back strain. No fever. No family at bedside.    VITALS:  Blood pressure 99/69, pulse (!) 52, temperature 98.2 F (36.8 C), resp. rate 18, last menstrual period 11/04/2021, SpO2 100 %.  PHYSICAL EXAMINATION:   GENERAL:  31 y.o.-year-old patient lying in the bed with no acute distress.  LUNGS: Normal breath sounds bilaterally, no wheezing, rales, rhonchi.  CARDIOVASCULAR: S1, S2 normal. No murmurs, rubs, or gallops.  ABDOMEN: Soft, nontender, nondistended. Bowel sounds present.  EXTREMITIES: No  edema b/l.    NEUROLOGIC: nonfocal  patient is alert and awake SKIN: No obvious rash, lesion, or ulcer.   LABORATORY PANEL:  CBC Recent Labs  Lab 12/07/21 0714  WBC 9.5  HGB 9.1*  HCT 27.1*  PLT 502*    Chemistries  Recent Labs  Lab 12/05/21 0634  NA 141  K 3.8  CL 111  CO2 24  GLUCOSE 86  BUN 18  CREATININE 0.80  CALCIUM 9.3  AST 13*  ALT 11  ALKPHOS 39  BILITOT 1.1    Assessment and Plan  Crystal Short is a 31 y.o. female with medical history significant for syphilis, trichomoniasis infection, recent E. coli UTI/hemorrhagic cystitis pyelonephritis (initially treated with Rocephin and discharged on ciprofloxacin, completed antibiotics on 12/02/2021).  She presented to the hospital with complaints of fever, dysuria pain on both sides of her lower back, hematuria, dizziness, headache, nausea   She was admitted to the hospital for probable acute and gross hematuria leading to acute blood loss anemia. Denies vomiting blood  Acute blood loss anemia secondary to gross hematuria-- unclear etiology recent E. coli hemorrhagic cystitis-- completed antibiotic -- patient continues to have hematuria -- came in with hemoglobin of 8.5--  7.8--two units blood transfusion -- 9.1 -- baseline hemoglobin 12.1 -- repeat urine culture this admission no growth. Discontinue antibiotic. Discussed with urology -- patient seen by urology. Plans for cystoscopy with bilateral retrograde bilateral pyelogram this Friday  no evidence of G.I. bleed. -- Dr. Timothy Lasso was consulted initially no indication for any inpatient evaluation.     Family communication : none Consults : urology Dr. Lonna Cobb CODE STATUS: full DVT Prophylaxis : ambulation SCD Level of care: Med-Surg Status is: Inpatient Remains inpatient appropriate because: cystoscopy scheduled on Friday    TOTAL TIME TAKING CARE OF THIS PATIENT: 25 minutes.  >50% time spent on counselling and coordination of care  Note: This dictation was prepared with Dragon dictation along with smaller phrase technology. Any transcriptional errors that result from this process are unintentional.  Enedina Finner M.D    [facility]   CC: Primary care physician; Patient, No Pcp Per

## 2021-12-07 NOTE — Plan of Care (Signed)
Pt AAOx4, no pain. VS are stable. Still having frank blood in her urine. MD aware and referring to urology. Plan for pain control and mobility as tolerated. Bed is in lowest position, call light within reach. Will continue to monitor.

## 2021-12-07 NOTE — Progress Notes (Signed)
Patient continues to have gross hematuria per Dr. Allena Katz. She is s/p 2 units pRBCs, most recently yesterday, and hemoglobin this morning is up at 9.1.  Admission urine culture negative; ok to stop antibiotics. Gross hematuria not likely to be infectious in origin at this point; differential includes sickle cell trait versus other urinary tract abnormality.   Given persistent bleeding, will plan for cystoscopy and bilateral retrograde pyelograms with possible biopsy and possible clot evacuation with Dr. Richardo Hanks on Friday, 7/28. Please make her NPO at midnight prior.  Carman Ching, PA-C Urology 12/07/21 12:41 PM

## 2021-12-08 DIAGNOSIS — R310 Gross hematuria: Secondary | ICD-10-CM | POA: Diagnosis not present

## 2021-12-08 DIAGNOSIS — D649 Anemia, unspecified: Secondary | ICD-10-CM | POA: Diagnosis not present

## 2021-12-08 LAB — HEMOGLOBIN: Hemoglobin: 9.6 g/dL — ABNORMAL LOW (ref 12.0–15.0)

## 2021-12-08 MED ORDER — BISACODYL 10 MG RE SUPP
10.0000 mg | Freq: Every day | RECTAL | Status: DC
  Administered 2021-12-08: 10 mg via RECTAL
  Filled 2021-12-08 (×4): qty 1

## 2021-12-08 MED ORDER — FERROUS SULFATE 325 (65 FE) MG PO TABS
325.0000 mg | ORAL_TABLET | Freq: Two times a day (BID) | ORAL | Status: DC
  Administered 2021-12-08 – 2021-12-12 (×8): 325 mg via ORAL
  Filled 2021-12-08 (×8): qty 1

## 2021-12-08 MED ORDER — POLYETHYLENE GLYCOL 3350 17 G PO PACK
17.0000 g | PACK | Freq: Two times a day (BID) | ORAL | Status: DC
  Administered 2021-12-08: 17 g via ORAL
  Filled 2021-12-08 (×6): qty 1

## 2021-12-08 MED ORDER — SODIUM CHLORIDE 0.9 % IV SOLN
200.0000 mg | INTRAVENOUS | Status: AC
  Administered 2021-12-08 – 2021-12-10 (×3): 200 mg via INTRAVENOUS
  Filled 2021-12-08 (×3): qty 200

## 2021-12-08 NOTE — Progress Notes (Signed)
[facility]  - [place] at [facility]   PATIENT NAME: Crystal Short    MR#:  [id number]  DATE OF BIRTH:  24-Jan-1991  SUBJECTIVE:  patient continues to have gross hematuria.  Complains of low back strain. No fever. No family at bedside.   VITALS:  Blood pressure 120/85, pulse 66, temperature 98.3 F (36.8 C), temperature source Oral, resp. rate 18, weight 60.9 kg, last menstrual period 11/04/2021, SpO2 100 %.  PHYSICAL EXAMINATION:   GENERAL:  31 y.o.-year-old patient lying in the bed with no acute distress.  LUNGS: Normal breath sounds bilaterally, no wheezing, rales, rhonchi.  CARDIOVASCULAR: S1, S2 normal. No murmurs, rubs, or gallops.  ABDOMEN: Soft, nontender, nondistended. Bowel sounds present.  EXTREMITIES: No  edema b/l.    NEUROLOGIC: nonfocal  patient is alert and awake SKIN: No obvious rash, lesion, or ulcer.   LABORATORY PANEL:  CBC Recent Labs  Lab 12/07/21 0714  WBC 9.5  HGB 9.1*  HCT 27.1*  PLT 502*     Chemistries  Recent Labs  Lab 12/05/21 0634  NA 141  K 3.8  CL 111  CO2 24  GLUCOSE 86  BUN 18  CREATININE 0.80  CALCIUM 9.3  AST 13*  ALT 11  ALKPHOS 39  BILITOT 1.1     Assessment and Plan  Sadye Kiernan is a 31 y.o. female with medical history significant for syphilis, trichomoniasis infection, recent E. coli UTI/hemorrhagic cystitis pyelonephritis (initially treated with Rocephin and discharged on ciprofloxacin, completed antibiotics on 12/02/2021).  She presented to the hospital with complaints of fever, dysuria pain on both sides of her lower back, hematuria, dizziness, headache, nausea   She was admitted to the hospital for probable acute and gross hematuria leading to acute blood loss anemia. Denies vomiting blood  Acute blood loss anemia secondary to gross hematuria-- unclear etiology recent E. coli hemorrhagic cystitis-- completed antibiotic -- patient continues to have hematuria -- came in with hemoglobin of  8.5-- 7.8--two units blood transfusion -- 9.1 -- baseline hemoglobin 12.1 -- repeat urine culture this admission no growth. Discontinue antibiotic. Discussed with urology -- patient seen by urology. Plans for cystoscopy with bilateral retrograde bilateral pyelogram this Friday  no evidence of G.I. bleed. -- Dr. Timothy Lasso was consulted initially no indication for any inpatient evaluation.     Family communication : none Consults : urology Dr. Lonna Cobb CODE STATUS: full DVT Prophylaxis : ambulation SCD Level of care: Med-Surg Status is: Inpatient Remains inpatient appropriate because: cystoscopy scheduled on Friday    TOTAL TIME TAKING CARE OF THIS PATIENT: 25 minutes.  >50% time spent on counselling and coordination of care  Note: This dictation was prepared with Dragon dictation along with smaller phrase technology. Any transcriptional errors that result from this process are unintentional.  Enedina Finner M.D    [facility]   CC: Primary care physician; Patient, No Pcp Per

## 2021-12-08 NOTE — Plan of Care (Signed)
  Problem: Elimination: Goal: Will not experience complications related to urinary retention Outcome: Not Progressing   Problem: Pain Managment: Goal: General experience of comfort will improve Outcome: Not Progressing   

## 2021-12-08 NOTE — Plan of Care (Signed)
Pt AAOx4, moderate to severe back pain that comes and goes. VS are stable. Plan for pain control. Cystoscopy tomorrow, NPO at midnight. Bed in lowest position, call light within reach. Will continue to monitor.

## 2021-12-08 NOTE — Progress Notes (Signed)
Urology Inpatient Progress Note  Subjective: No acute events overnight.  She is afebrile, VSS. Morning labs not available. She reports ongoing gross hematuria with passage of small clots.  She feels she is emptying her bladder well and denies pain today.  Anti-infectives: Anti-infectives (From admission, onward)    Start     Dose/Rate Route Frequency Ordered Stop   12/05/21 1800  ceFEPIme (MAXIPIME) 2 g in sodium chloride 0.9 % 100 mL IVPB  Status:  Discontinued        2 g 200 mL/hr over 30 Minutes Intravenous Every 8 hours 12/05/21 1448 12/07/21 1454   12/05/21 0000  ceFEPIme (MAXIPIME) 2 g in sodium chloride 0.9 % 100 mL IVPB  Status:  Discontinued        2 g 200 mL/hr over 30 Minutes Intravenous Every 12 hours 12/04/21 2328 12/05/21 1448       Current Facility-Administered Medications  Medication Dose Route Frequency Provider Last Rate Last Admin   acetaminophen (TYLENOL) tablet 650 mg  650 mg Oral Q6H PRN Lurene Shadow, MD   650 mg at 12/06/21 2005   Or   acetaminophen (TYLENOL) suppository 650 mg  650 mg Rectal Q6H PRN Lurene Shadow, MD       ferrous sulfate tablet 325 mg  325 mg Oral BID WC Enedina Finner, MD       iron sucrose (VENOFER) 200 mg in sodium chloride 0.9 % 100 mL IVPB  200 mg Intravenous Q24H Enedina Finner, MD       morphine (PF) 2 MG/ML injection 1 mg  1 mg Intravenous Q4H PRN Lurene Shadow, MD   1 mg at 12/07/21 2113   ondansetron (ZOFRAN) injection 4 mg  4 mg Intravenous Q6H PRN Lurene Shadow, MD   4 mg at 12/06/21 1149   pantoprazole (PROTONIX) EC tablet 40 mg  40 mg Oral Daily Enedina Finner, MD   40 mg at 12/07/21 1214   sodium chloride flush (NS) 0.9 % injection 3 mL  3 mL Intravenous Q12H Lurene Shadow, MD   3 mL at 12/07/21 2114   traMADol (ULTRAM) tablet 50 mg  50 mg Oral Q6H PRN Enedina Finner, MD   50 mg at 12/07/21 1214   Objective: Vital signs in last 24 hours: Temp:  [97.8 F (36.6 C)-98.6 F (37 C)] 98 F (36.7 C) (07/27 0323) Pulse Rate:  [50-57]  51 (07/27 0323) Resp:  [18-20] 20 (07/27 0323) BP: (106-118)/(71-86) 118/86 (07/27 0323) SpO2:  [100 %] 100 % (07/27 0323) Weight:  [60.9 kg] 60.9 kg (07/27 0406)  Intake/Output from previous day: 07/26 0701 - 07/27 0700 In: 880 [P.O.:480; IV Piggyback:400] Out: 300 [Urine:300] Intake/Output this shift: No intake/output data recorded.  Physical Exam Vitals and nursing note reviewed.  Constitutional:      General: She is not in acute distress.    Appearance: She is not ill-appearing, toxic-appearing or diaphoretic.  HENT:     Head: Normocephalic and atraumatic.  Pulmonary:     Effort: Pulmonary effort is normal. No respiratory distress.  Skin:    General: Skin is warm and dry.  Neurological:     Mental Status: She is alert and oriented to person, place, and time.  Psychiatric:        Mood and Affect: Mood normal.        Behavior: Behavior normal.    Lab Results:  Recent Labs    12/06/21 0617 12/07/21 0714  WBC 6.0 9.5  HGB 7.8* 9.1*  HCT 23.0* 27.1*  PLT 488* 502*   Assessment & Plan: 31 year old female with sickle cell trait and a recent history of admission with gross hematuria and possible hemorrhagic cystitis with pyelonephritis at [facility] now admitted with ongoing gross hematuria requiring 2 units of PRBCs during her current admission.  Please see brief progress note from yesterday.  Considering her ongoing gross hematuria requiring transfusion, we recommend proceeding to the OR tomorrow with Dr. Richardo Hanks for cystoscopy and bilateral retrogrades with possible clot EVAC versus fulguration versus biopsy.  I discussed the plan with the patient and she is in agreement.  Please make her n.p.o. at midnight tonight.  Carman Ching, PA-C 12/08/2021

## 2021-12-09 ENCOUNTER — Encounter
Admission: EM | Disposition: A | Discharge status: Home / Self Care | Payer: Self-pay | Source: Home / Self Care | Attending: Internal Medicine

## 2021-12-09 ENCOUNTER — Encounter: Payer: Self-pay | Admitting: Internal Medicine

## 2021-12-09 ENCOUNTER — Inpatient Hospital Stay: Payer: Medicaid Other

## 2021-12-09 ENCOUNTER — Inpatient Hospital Stay: Payer: Medicaid Other | Admitting: Anesthesiology

## 2021-12-09 ENCOUNTER — Other Ambulatory Visit: Payer: Self-pay

## 2021-12-09 DIAGNOSIS — D573 Sickle-cell trait: Secondary | ICD-10-CM | POA: Diagnosis not present

## 2021-12-09 DIAGNOSIS — N12 Tubulo-interstitial nephritis, not specified as acute or chronic: Secondary | ICD-10-CM

## 2021-12-09 DIAGNOSIS — D649 Anemia, unspecified: Secondary | ICD-10-CM | POA: Diagnosis not present

## 2021-12-09 DIAGNOSIS — R310 Gross hematuria: Secondary | ICD-10-CM | POA: Diagnosis not present

## 2021-12-09 DIAGNOSIS — D62 Acute posthemorrhagic anemia: Secondary | ICD-10-CM | POA: Diagnosis not present

## 2021-12-09 HISTORY — PX: CYSTOSCOPY W/ RETROGRADES: SHX1426

## 2021-12-09 HISTORY — PX: CYSTOSCOPY WITH BIOPSY: SHX5122

## 2021-12-09 HISTORY — PX: CYSTOSCOPY WITH FULGERATION: SHX6638

## 2021-12-09 SURGERY — CYSTOSCOPY, WITH RETROGRADE PYELOGRAM
Anesthesia: General

## 2021-12-09 MED ORDER — FENTANYL CITRATE (PF) 100 MCG/2ML IJ SOLN
INTRAMUSCULAR | Status: DC | PRN
  Administered 2021-12-09: 25 ug via INTRAVENOUS
  Administered 2021-12-09: 50 ug via INTRAVENOUS
  Administered 2021-12-09: 25 ug via INTRAVENOUS

## 2021-12-09 MED ORDER — LIDOCAINE HCL (CARDIAC) PF 100 MG/5ML IV SOSY
PREFILLED_SYRINGE | INTRAVENOUS | Status: DC | PRN
  Administered 2021-12-09: 60 mg via INTRAVENOUS

## 2021-12-09 MED ORDER — ACETAMINOPHEN 10 MG/ML IV SOLN
INTRAVENOUS | Status: AC
Start: 2021-12-09 — End: ?
  Filled 2021-12-09: qty 100

## 2021-12-09 MED ORDER — MIDAZOLAM HCL 2 MG/2ML IJ SOLN
INTRAMUSCULAR | Status: DC | PRN
  Administered 2021-12-09: 2 mg via INTRAVENOUS

## 2021-12-09 MED ORDER — FENTANYL CITRATE (PF) 100 MCG/2ML IJ SOLN
25.0000 ug | INTRAMUSCULAR | Status: DC | PRN
  Administered 2021-12-09: 25 ug via INTRAVENOUS

## 2021-12-09 MED ORDER — ONDANSETRON HCL 4 MG/2ML IJ SOLN
4.0000 mg | Freq: Once | INTRAMUSCULAR | Status: AC | PRN
Start: 2021-12-09 — End: 2021-12-09
  Administered 2021-12-09: 4 mg via INTRAVENOUS

## 2021-12-09 MED ORDER — ACETAMINOPHEN 10 MG/ML IV SOLN
INTRAVENOUS | Status: AC
  Administered 2021-12-09: 1000 mg via INTRAVENOUS
  Filled 2021-12-09: qty 100

## 2021-12-09 MED ORDER — KETOROLAC TROMETHAMINE 15 MG/ML IJ SOLN
15.0000 mg | Freq: Two times a day (BID) | INTRAMUSCULAR | Status: DC | PRN
  Administered 2021-12-09: 15 mg via INTRAVENOUS
  Filled 2021-12-09: qty 1

## 2021-12-09 MED ORDER — ACETAMINOPHEN 10 MG/ML IV SOLN: 1000.0000 mg | Freq: Once | INTRAVENOUS | Status: AC

## 2021-12-09 MED ORDER — ONDANSETRON HCL 4 MG/2ML IJ SOLN
INTRAMUSCULAR | Status: AC
  Filled 2021-12-09: qty 2

## 2021-12-09 MED ORDER — SODIUM CHLORIDE 0.9 % IR SOLN
Status: DC | PRN
  Administered 2021-12-09: 3000 mL via INTRAVESICAL

## 2021-12-09 MED ORDER — SODIUM CHLORIDE 0.9 % IV SOLN: INTRAVENOUS | Status: AC

## 2021-12-09 MED ORDER — AMINOCAPROIC ACID 0.25 GM/ML PO SOLN
2.0000 g | Freq: Four times a day (QID) | ORAL | Status: DC
  Administered 2021-12-09 – 2021-12-12 (×10): 2 g via ORAL
  Filled 2021-12-09 (×13): qty 8

## 2021-12-09 MED ORDER — CEFAZOLIN SODIUM-DEXTROSE 1-4 GM/50ML-% IV SOLN
INTRAVENOUS | Status: DC | PRN
  Administered 2021-12-09: 1 g via INTRAVENOUS

## 2021-12-09 MED ORDER — MIDAZOLAM HCL 2 MG/2ML IJ SOLN
INTRAMUSCULAR | Status: AC
  Filled 2021-12-09: qty 2

## 2021-12-09 MED ORDER — DEXAMETHASONE SODIUM PHOSPHATE 10 MG/ML IJ SOLN
INTRAMUSCULAR | Status: AC
  Filled 2021-12-09: qty 1

## 2021-12-09 MED ORDER — PROPOFOL 10 MG/ML IV BOLUS
INTRAVENOUS | Status: DC | PRN
  Administered 2021-12-09: 150 mg via INTRAVENOUS

## 2021-12-09 MED ORDER — PROPOFOL 10 MG/ML IV BOLUS
INTRAVENOUS | Status: AC
  Filled 2021-12-09: qty 20

## 2021-12-09 MED ORDER — ONDANSETRON HCL 4 MG/2ML IJ SOLN
INTRAMUSCULAR | Status: DC | PRN
  Administered 2021-12-09: 4 mg via INTRAVENOUS

## 2021-12-09 MED ORDER — IOHEXOL 180 MG/ML  SOLN
INTRAMUSCULAR | Status: DC | PRN
  Administered 2021-12-09: 10 mL

## 2021-12-09 MED ORDER — LACTATED RINGERS IV SOLN: INTRAVENOUS | Status: DC | PRN

## 2021-12-09 MED ORDER — DEXAMETHASONE SODIUM PHOSPHATE 10 MG/ML IJ SOLN
INTRAMUSCULAR | Status: DC | PRN
  Administered 2021-12-09: 10 mg via INTRAVENOUS

## 2021-12-09 MED ORDER — FENTANYL CITRATE (PF) 100 MCG/2ML IJ SOLN
INTRAMUSCULAR | Status: AC
  Filled 2021-12-09: qty 2

## 2021-12-09 MED ORDER — STERILE WATER FOR IRRIGATION IR SOLN
Status: DC | PRN
  Administered 2021-12-09 (×2): 3000 mL via INTRAVESICAL

## 2021-12-09 MED ORDER — PHENYLEPHRINE 80 MCG/ML (10ML) SYRINGE FOR IV PUSH (FOR BLOOD PRESSURE SUPPORT)
PREFILLED_SYRINGE | INTRAVENOUS | Status: DC | PRN
  Administered 2021-12-09 (×3): 80 ug via INTRAVENOUS

## 2021-12-09 MED ORDER — LIDOCAINE HCL (PF) 2 % IJ SOLN
INTRAMUSCULAR | Status: AC
  Filled 2021-12-09: qty 5

## 2021-12-09 MED ORDER — AMINOCAPROIC ACID 500 MG PO TABS
2.0000 g | ORAL_TABLET | Freq: Four times a day (QID) | ORAL | Status: DC
  Filled 2021-12-09 (×2): qty 4

## 2021-12-09 SURGICAL SUPPLY — 29 items
BAG DRAIN CYSTO-URO LG1000N (MISCELLANEOUS) ×4 IMPLANT
BAG URINE DRAIN 2000ML AR STRL (UROLOGICAL SUPPLIES) ×4 IMPLANT
BRUSH SCRUB EZ  4% CHG (MISCELLANEOUS) ×1
BRUSH SCRUB EZ 1% IODOPHOR (MISCELLANEOUS) ×4 IMPLANT
BRUSH SCRUB EZ 4% CHG (MISCELLANEOUS) ×3 IMPLANT
CATH FOL 2WAY LX 18X30 (CATHETERS) ×4 IMPLANT
CATH URETL OPEN 5X70 (CATHETERS) ×4 IMPLANT
DRAPE UTILITY 15X26 TOWEL STRL (DRAPES) ×4 IMPLANT
DRSG TELFA 3X4 N-ADH STERILE (GAUZE/BANDAGES/DRESSINGS) ×4 IMPLANT
ELECT REM PT RETURN 9FT ADLT (ELECTROSURGICAL) ×4
ELECTRODE REM PT RTRN 9FT ADLT (ELECTROSURGICAL) ×3 IMPLANT
GAUZE 4X4 16PLY ~~LOC~~+RFID DBL (SPONGE) ×8 IMPLANT
GLOVE SURG UNDER POLY LF SZ7.5 (GLOVE) ×4 IMPLANT
GOWN STRL REUS W/ TWL LRG LVL3 (GOWN DISPOSABLE) ×3 IMPLANT
GOWN STRL REUS W/ TWL XL LVL3 (GOWN DISPOSABLE) ×3 IMPLANT
GOWN STRL REUS W/TWL LRG LVL3 (GOWN DISPOSABLE) ×1
GOWN STRL REUS W/TWL XL LVL3 (GOWN DISPOSABLE) ×1
GUIDEWIRE STR DUAL SENSOR (WIRE) ×5 IMPLANT
IV NS IRRIG 3000ML ARTHROMATIC (IV SOLUTION) ×4 IMPLANT
KIT TURNOVER CYSTO (KITS) ×4 IMPLANT
PACK CYSTO AR (MISCELLANEOUS) ×4 IMPLANT
SET CYSTO W/LG BORE CLAMP LF (SET/KITS/TRAYS/PACK) ×4 IMPLANT
SURGILUBE 2OZ TUBE FLIPTOP (MISCELLANEOUS) ×4 IMPLANT
SYR TOOMEY IRRIG 70ML (MISCELLANEOUS) ×4
SYRINGE TOOMEY IRRIG 70ML (MISCELLANEOUS) ×3 IMPLANT
VALVE UROSEAL ADJ ENDO (VALVE) ×1 IMPLANT
WATER STERILE IRR 1000ML POUR (IV SOLUTION) ×4 IMPLANT
WATER STERILE IRR 3000ML UROMA (IV SOLUTION) ×5 IMPLANT
WATER STERILE IRR 500ML POUR (IV SOLUTION) ×4 IMPLANT

## 2021-12-09 NOTE — Op Note (Addendum)
Date of procedure: 12/09/21  Preoperative diagnosis:  Gross hematuria  Postoperative diagnosis:  Same  Procedure: Cystoscopy, clot evacuation Right retrograde pyelogram with intraoperative interpretation Left retrograde pyelogram with intraoperative interpretation Left diagnostic ureteroscopy, fulguration of bleeding in left kidney  Surgeon: Legrand Rams, MD  Anesthesia: General  Complications: None  Intraoperative findings:  32ml old clot evacuated from bladder.  Thorough cystoscopy showed no suspicious bladder lesions, ureteral orifices orthotopic bilaterally Frankly bloody efflux from left ureter, right ureteral efflux clear Bilateral retrograde pyelograms normal Left diagnostic ureteroscopy with no ureteral abnormalities, subtle petechiae and oozing from multiple renal papilla, bleeding areas fulgurated Clear efflux from left ureteral orifice at conclusion of procedure, no stent placed  EBL: Minimal  Specimens: None  Drains: None  Indication: Ta Fair is a 31 y.o. patient with sickle cell trait, recent pyelonephritis, and ongoing gross hematuria and clots requiring transfusion of unclear etiology.  After reviewing the management options for treatment, they elected to proceed with the above surgical procedure(s). We have discussed the potential benefits and risks of the procedure, side effects of the proposed treatment, the likelihood of the patient achieving the goals of the procedure, and any potential problems that might occur during the procedure or recuperation. Informed consent has been obtained.  Description of procedure:  The patient was taken to the operating room and general anesthesia was induced. SCDs were placed for DVT prophylaxis. The patient was placed in the dorsal lithotomy position, prepped and draped in the usual sterile fashion, and preoperative antibiotics(Ancef) were administered. A preoperative time-out was performed.   A 21 French rigid  cystoscope was used to intubate the urethra, and urine in the bladder was bloody.  This was irrigated free and 25 mL of old clot was evacuated.  Urine in the bladder was then crystal-clear and thorough cystoscopy showed no suspicious lesions, and the ureteral orifices were orthotopic bilaterally.  There was clear efflux from the right ureter, however frankly bloody efflux from the left ureteral orifice.  Retrograde pyelograms were performed bilaterally using a 5 French access catheter which showed no hydronephrosis or filling defects.  A sensor wire passed easily into the left ureteral orifice and up to the kidney under fluoroscopic vision.  A semirigid ureteroscope was advanced up to the proximal ureter and the ureter was widely patent with no abnormalities or lesions.  A second safety sensor wire was added through the scope.  A digital single-channel flexible ureteroscope then advanced easily over the wire up to the kidney.  Thorough pyeloscopy revealed some subtle oozing from the papilla and multiple calyces.  The ureteroscopic Bugbee was advanced through the scope, fluids changed to water, and very meticulous fulguration was performed of these oozing areas.  At the conclusion of the procedure there was no active bleeding noted.  A retrograde pyelogram was performed from the proximal ureter and showed some bubbles from prior fulguration, but no extravasation or hydronephrosis.  Careful pullback ureteroscopy showed no abnormalities.  The rigid cystoscope was reinserted and there was efflux of clear urine from the left ureteral orifice, and no stent was placed.  Disposition: Stable to PACU  Plan: -Suspect component of sickle cell trait hematuria in the setting of recent left-sided pyelonephritis, small areas of oozing fulgurated.  Would avoid Foley catheter unless true retention, would recommend consulting hematology regarding sickle cell hematuria. Could also consider longer course(2 weeks) of  antibiotics based on prior culture data from [facility], MD

## 2021-12-09 NOTE — Interval H&P Note (Signed)
UROLOGY H&P UPDATE  Agree with prior H&P dated 12/05/2021.  31 year old female with hemorrhagic cystitis of unclear etiology.  Recently treated for multidrug-resistant E. coli UTI/pyelonephritis, but has had persistent hematuria and clots requiring transfusion.  History notable for sickle cell trait.  She has had extensive imaging with CT and ultrasound that showed no hydronephrosis, but questionable filling defect in the bladder that could represent clot.  Cardiac: RRR Lungs: CTA bilaterally  Laterality: Bilateral Procedure: Cystoscopy, clot evacuation, possible biopsy and fulguration, bilateral retrograde pyelograms, possible diagnostic ureteroscopy  Urine: Culture 7/23 no growth  We specifically discussed the risks ureteroscopy including bleeding, infection/sepsis, stent related symptoms including flank pain/urgency/frequency/incontinence/dysuria, ureteral injury, inability to access stone, or need for staged or additional procedures.  We also if her bleeding is related to sickle cell trait there may not be a surgical intervention.  We discussed the low, but nonzero, risk of malignancy.   Sondra Come, MD 12/09/2021

## 2021-12-09 NOTE — Anesthesia Procedure Notes (Signed)
Procedure Name: LMA Insertion Date/Time: 12/09/2021 10:45 AM  Performed by: Tammi Klippel, CRNAPre-anesthesia Checklist: Patient identified, Patient being monitored, Timeout performed, Emergency Drugs available and Suction available Patient Re-evaluated:Patient Re-evaluated prior to induction Oxygen Delivery Method: Circle system utilized Preoxygenation: Pre-oxygenation with 100% oxygen Induction Type: IV induction Ventilation: Mask ventilation without difficulty LMA: LMA inserted LMA Size: 3.0 Placement Confirmation: positive ETCO2 Tube secured with: Tape Dental Injury: Teeth and Oropharynx as per pre-operative assessment

## 2021-12-09 NOTE — Anesthesia Postprocedure Evaluation (Signed)
Anesthesia Post Note  Patient: Crystal Short  Procedure(s) Performed: CYSTOSCOPY WITH RETROGRADE PYELOGRAM (Bilateral) CYSTOSCOPY WITH FULGERATION AND CLOT EVACUATION CYSTOSCOPY WITH BLADDER OR URETERAL BIOPSY ANTEGRADE URETEROSCOPY (Left)  Patient location during evaluation: PACU Anesthesia Type: General Level of consciousness: awake and awake and alert Pain management: satisfactory to patient Vital Signs Assessment: post-procedure vital signs reviewed and stable Respiratory status: spontaneous breathing and nonlabored ventilation Cardiovascular status: stable Anesthetic complications: no   No notable events documented.   Last Vitals:  Vitals:   12/09/21 1230 12/09/21 1258  BP: 113/86 118/76  Pulse: (!) 59 (!) 47  Resp: 16   Temp: (!) 36.4 C 36.5 C  SpO2: 99% 100%    Last Pain:  Vitals:   12/09/21 1258  TempSrc: Oral  PainSc:                  VAN STAVEREN,Malayna Noori

## 2021-12-09 NOTE — Progress Notes (Signed)
Patient awake/alert x4, sleepy but easily arouses. Sleeping then awakens and c/o's cramping abd discomfort, medicated as ordered, but remains sleepy. Vitals stable, afebrile

## 2021-12-09 NOTE — Transfer of Care (Signed)
Immediate Anesthesia Transfer of Care Note  Patient: Crystal Short  Procedure(s) Performed: CYSTOSCOPY WITH RETROGRADE PYELOGRAM (Bilateral) CYSTOSCOPY WITH FULGERATION AND CLOT EVACUATION CYSTOSCOPY WITH BLADDER OR URETERAL BIOPSY ANTEGRADE URETEROSCOPY (Left)  Patient Location: PACU  Anesthesia Type:General  Level of Consciousness: sedated  Airway & Oxygen Therapy: Patient Spontanous Breathing and Patient connected to face mask oxygen  Post-op Assessment: Post -op Vital signs reviewed and stable  Post vital signs: Reviewed and stable  Last Vitals:  Vitals Value Taken Time  BP 139/91 12/09/21 1145  Temp    Pulse 80 12/09/21 1145  Resp 18 12/09/21 1145  SpO2 100 % 12/09/21 1145  Vitals shown include unvalidated device data.  Last Pain:  Vitals:   12/09/21 0957  TempSrc: Temporal  PainSc: 0-No pain      Patients Stated Pain Goal: 0 (12/08/21 2147)  Complications: No notable events documented.

## 2021-12-09 NOTE — Consult Note (Addendum)
Hematology/Oncology Consult note [phone] [phone]      Patient Care Team: Patient, No Pcp Per as PCP - General (General Practice)   Name of the patient: Crystal Short  [id number]  March 07, 1991   Date of visit: 12/09/21 REASON FOR COSULTATION:  Hematuria History of presenting illness-  31 y.o. female with PMH listed at below who presents to ER for evaluation of hematuria since July 7,  [facility] [place] [facility] records reviewed-care everywhere. 11/22/2021 - 11/27/2021.  She was recently admitted to [facility] due to flank pain, diagnosed with urinary tract infection and E. coli E coli pyelonephritis. patient was treated with ceftriaxone.  Patient had hematuria. 11/26/2021, CT renal protocol showed hypodense lesion of the right mid kidney involving the renal cortex measuring 1.9 cm which persist on delayed phase.  Similar to prior.  Left kidney nonobstructing punctate renal calculi in the upper lobe unchanged in position.  No hydronephrosis.  0.7 cm hypodense lesion in the upper pole left kidney, unchanged. Persistent bilateral mildly striated nephrograms compatible with history of pyelonephritis. Patient was seen by [facility] urology service who thought that her bleeding was likely due to hemorrhagic cystitis.  Patient was discharged home with 5 days of Cipro. Patient presented to [facility] ER 12/04/2021 for evaluation of persistent hematuria.  Hematuria worsened after completion of antibiotics.+ Headache, patient takes Goody's powder daily.  +Fatigue and syncope episodes 12/04/2021, CT abdomen pelvis without contrast showed cholelithiasis.  No evidence of bowel obstruction or inflammation.  No renal or ureteral stone or obstruction. 12/04/2021, US renal showed limited visualization of the urinary bladder.  Normal ultrasonographic appearance of the kidneys. Her initial UA showed no pyuria or bacteriuria.  Urine culture Hemoglobin was decreased at 8.5, hemoglobin was 12.1 a few weeks  ago.  Iron panel showed TIBC 414, ferritin 12,patient received PRBC transfusion for symptomatic anemia.  Patient is currently on oral iron supplementation as inpatient.  IV Venofer infusions were ordered.  #12/09/2021, patient underwent cystoscopy for work-up of gross hematuria. 87ml old clot evacuated from bladder.  Thorough cystoscopy showed no suspicious bladder lesions, ureteral orifices orthotopic bilaterally Frankly bloody efflux from left ureter, right ureteral efflux clear Bilateral retrograde pyelograms normal Left diagnostic ureteroscopy with no ureteral abnormalities, subtle petechiae and oozing from multiple renal papilla, bleeding areas fulgurated Clear efflux from left ureteral orifice at conclusion of procedure, no stent placed  Urology suspect component of sickle cell trait hematuria in the setting of recent left-sided pyelonephritis, small area of oozing fulgurated.  Avoid Foley catheter.   History of sickle cell trait.  This was diagnosed during her pregnancy. Care everywhere [facility]  06/06/2019, sickle cell screen positive.  I do not see confirmatory testing in current EMR.  Dr. Valene Bors' Keeffe's diagnosed patient with sickle cell trait. Patient denies any previous sickle cell crisis episodes.  Her hemoglobin at baseline is normal.  Denies any previous hematuria events.  Currently she feels some nausea.  Denies any bone pain.   Review of Systems  Constitutional:  Positive for fatigue.  Eyes:  Negative for eye problems.  Respiratory:  Negative for chest tightness and hemoptysis.   Cardiovascular:  Negative for chest pain.  Gastrointestinal:  Positive for nausea.       Stomach discomfort  Genitourinary:  Positive for dysuria and hematuria.   Musculoskeletal:  Negative for arthralgias and flank pain.  Skin:  Negative for rash.  Neurological:  Positive for light-headedness.  Hematological:  Negative for adenopathy.  Psychiatric/Behavioral:  The patient is not  nervous/anxious.     Allergies  Allergen Reactions   Orange Fruit [Citrus] Swelling    Swelling is of the tongue.    Patient Active Problem List   Diagnosis Date Noted   Symptomatic anemia 12/05/2021   Persistent headaches 12/05/2021   Gross hematuria 12/05/2021   Dizziness 12/05/2021   GIB (gastrointestinal bleeding) 12/05/2021   Hematemesis 12/05/2021   Syphilis 02/11/2021   Marijuana use 11/22/2020   Vapes nicotine containing substance 03/12/2020   Sickle-cell trait (HCC) 09/21/2015     Past Medical History:  Diagnosis Date   Syphilis 09/15/2016   reactive RPR 1:8   Trichomonas infection      Past Surgical History:  Procedure Laterality Date   NO PAST SURGERIES      Social History   Socioeconomic History   Marital status: Single    Spouse name: Not on file   Number of children: Not on file   Years of education: Not on file   Highest education level: Not on file  Occupational History   Not on file  Tobacco Use   Smoking status: Every Day    Types: E-cigarettes, Cigars   Smokeless tobacco: Never  Vaping Use   Vaping Use: Every day   Start date: 12/02/2019   Substances: Nicotine, Flavoring  Substance and Sexual Activity   Alcohol use: Yes    Alcohol/week: 8.0 standard drinks of alcohol    Types: 5 Cans of beer, 3 Shots of liquor per week   Drug use: Yes    Types: Marijuana    Comment: daily   Sexual activity: Yes    Partners: Male    Birth control/protection: Condom  Other Topics Concern   Not on file  Social History Narrative   Not on file   Social Determinants of Health   Financial Resource Strain: Not on file  Food Insecurity: Not on file  Transportation Needs: Not on file  Physical Activity: Not on file  Stress: Not on file  Social Connections: Not on file  Intimate Partner Violence: Not At Risk (08/10/2021)   Humiliation, Afraid, Rape, and Kick questionnaire    Fear of Current or Ex-Partner: No    Emotionally Abused: No     Physically Abused: No    Sexually Abused: No     Family History  Problem Relation Age of Onset   Diabetes Mother    Lung disease Mother    Lung cancer Mother    Diabetes Brother    Diabetes Maternal Grandmother    Sickle cell trait Father    Sickle cell trait Paternal Grandmother    Congestive Heart Failure Paternal Grandfather    Sickle cell anemia Half-Sister      Current Facility-Administered Medications:    acetaminophen (TYLENOL) tablet 650 mg, 650 mg, Oral, Q6H PRN, 650 mg at 12/08/21 2147 **OR** acetaminophen (TYLENOL) suppository 650 mg, 650 mg, Rectal, Q6H PRN, Sondra Come, MD   bisacodyl (DULCOLAX) suppository 10 mg, 10 mg, Rectal, Daily, Legrand Rams C, MD, 10 mg at 12/08/21 1156   fentaNYL (SUBLIMAZE) 100 MCG/2ML injection, , , ,    ferrous sulfate tablet 325 mg, 325 mg, Oral, BID WC, Sninsky, Brian C, MD, 325 mg at 12/08/21 1645   iron sucrose (VENOFER) 200 mg in sodium chloride 0.9 % 100 mL IVPB, 200 mg, Intravenous, Q24H, Legrand Rams C, MD, Last Rate: 440 mL/hr at 12/09/21 1312, 200 mg at 12/09/21 1312   ondansetron (ZOFRAN) 4 MG/2ML injection, , , ,  ondansetron (ZOFRAN) injection 4 mg, 4 mg, Intravenous, Q6H PRN, Sondra ComeSninsky, Brian C, MD, 4 mg at 12/08/21 1156   pantoprazole (PROTONIX) EC tablet 40 mg, 40 mg, Oral, Daily, Legrand RamsSninsky, Brian C, MD, 40 mg at 12/08/21 0937   polyethylene glycol (MIRALAX / GLYCOLAX) packet 17 g, 17 g, Oral, BID, Legrand RamsSninsky, Brian C, MD, 17 g at 12/08/21 1156   sodium chloride flush (NS) 0.9 % injection 3 mL, 3 mL, Intravenous, Q12H, Legrand RamsSninsky, Brian C, MD, 3 mL at 12/08/21 2147   traMADol (ULTRAM) tablet 50 mg, 50 mg, Oral, Q6H PRN, Sondra ComeSninsky, Brian C, MD, 50 mg at 12/09/21 1419   Physical exam:  Vitals:   12/09/21 1215 12/09/21 1230 12/09/21 1258 12/09/21 1530  BP: 114/89 113/86 118/76 112/66  Pulse: (!) 54 (!) 59 (!) 47 (!) 53  Resp: 18 16  17   Temp:  (!) 97.5 F (36.4 C) 97.7 F (36.5 C) 98.2 F (36.8 C)  TempSrc:   Oral    SpO2: 100% 99% 100% 100%  Weight:      Height:       Physical Exam Constitutional:      General: She is not in acute distress.    Appearance: She is not diaphoretic.  HENT:     Head: Normocephalic and atraumatic.     Nose: Nose normal.     Mouth/Throat:     Pharynx: No oropharyngeal exudate.  Eyes:     General: No scleral icterus.    Pupils: Pupils are equal, round, and reactive to light.  Cardiovascular:     Rate and Rhythm: Regular rhythm. Bradycardia present.     Heart sounds: No murmur heard. Pulmonary:     Effort: Pulmonary effort is normal. No respiratory distress.  Abdominal:     General: There is no distension.     Palpations: Abdomen is soft.  Musculoskeletal:        General: Normal range of motion.     Cervical back: Normal range of motion and neck supple.  Skin:    General: Skin is warm and dry.  Neurological:     Mental Status: She is alert and oriented to person, place, and time.     Cranial Nerves: No cranial nerve deficit.     Motor: No abnormal muscle tone.     Coordination: Coordination normal.  Psychiatric:        Mood and Affect: Mood and affect normal.      Labs.    Latest Ref Rng & Units 12/08/2021    7:50 PM 12/07/2021    7:14 AM 12/06/2021    6:17 AM  CBC  WBC 4.0 - 10.5 K/uL  9.5  6.0   Hemoglobin 12.0 - 15.0 g/dL 9.6  9.1  7.8   Hematocrit 36.0 - 46.0 %  27.1  23.0   Platelets 150 - 400 K/uL  502  488       Latest Ref Rng & Units 12/05/2021    6:34 AM 12/04/2021    3:47 PM 11/21/2021    4:15 PM  CMP  Glucose 70 - 99 mg/dL 86  89  92   BUN 6 - 20 mg/dL 18  15  10    Creatinine 0.44 - 1.00 mg/dL 0.980.80  1.190.85  1.470.90   Sodium 135 - 145 mmol/L 141  140  139   Potassium 3.5 - 5.1 mmol/L 3.8  3.8  3.7   Chloride 98 - 111 mmol/L 111  110  109   CO2 22 -  32 mmol/L 24  24  22    Calcium 8.9 - 10.3 mg/dL 9.3  9.4  9.4   Total Protein 6.5 - 8.1 g/dL 6.7  7.5  7.2   Total Bilirubin 0.3 - 1.2 mg/dL 1.1  0.6  0.7   Alkaline Phos 38 - 126 U/L 39  49   60   AST 15 - 41 U/L 13  18  14    ALT 0 - 44 U/L 11  14  9       RADIOGRAPHIC STUDIES: I have personally reviewed the radiological images as listed and agreed with the findings in the report. DG OR UROLOGY CYSTO IMAGE ([facility] ONLY)  Result Date: 12/09/2021 There is no interpretation for this exam.  This order is for images obtained during a surgical procedure.  Please See "Surgeries" Tab for more information regarding the procedure.   MR BRAIN WO CONTRAST  Result Date: 12/05/2021 CLINICAL DATA:  Headache and blurry vision EXAM: MRI HEAD WITHOUT CONTRAST TECHNIQUE: Multiplanar, multiecho pulse sequences of the brain and surrounding structures were obtained without intravenous contrast. COMPARISON:  None Available. FINDINGS: Brain: No acute infarct, mass effect or extra-axial collection. No acute or chronic hemorrhage. Normal white matter signal, parenchymal volume and CSF spaces. The midline structures are normal. Vascular: Major flow voids are preserved. Skull and upper cervical spine: Normal calvarium and skull base. Visualized upper cervical spine and soft tissues are normal. Sinuses/Orbits:No paranasal sinus fluid levels or advanced mucosal thickening. No mastoid or middle ear effusion. Normal orbits. IMPRESSION: Normal brain MRI. Electronically Signed   By: M.D.   On: 12/05/2021 01:40   12/07/2021 Renal  Result Date: 12/04/2021 CLINICAL DATA:  Gross hematuria. EXAM: RENAL / URINARY TRACT ULTRASOUND COMPLETE COMPARISON:  None Available. FINDINGS: Right Kidney: Renal measurements: 9.8 cm x 3.2 cm x 4.2 cm = volume: 70.2 mL. Echogenicity within normal limits. No mass or hydronephrosis visualized. Left Kidney: Renal measurements: 10.0 cm x 5.6 cm x 4.3 cm = volume: 123 mL. Echogenicity within normal limits. No mass or hydronephrosis visualized. Bladder: Poorly distended and subsequently limited in evaluation. Other: None. IMPRESSION: Limited visualization of the urinary bladder, as described  above, with normal ultrasonographic appearance of kidneys. Electronically Signed   By: 12/07/2021 M.D.   On: 12/04/2021 23:50   CT ABDOMEN PELVIS WO CONTRAST  Result Date: 12/04/2021 CLINICAL DATA:  Pelvic pain. Negative beta HCG. Hematuria. Epigastric pain. Hematuria and hematemesis for 3 weeks. Patient has been treated with antibiotics for kidney infection for 2 weeks. Syncopal episodes for 3 weeks. EXAM: CT ABDOMEN AND PELVIS WITHOUT CONTRAST TECHNIQUE: Multidetector CT imaging of the abdomen and pelvis was performed following the standard protocol without IV contrast. RADIATION DOSE REDUCTION: This exam was performed according to the departmental dose-optimization program which includes automated exposure control, adjustment of the mA and/or kV according to patient size and/or use of iterative reconstruction technique. COMPARISON:  08/26/2016 FINDINGS: Lower chest: Lung bases are clear. Hepatobiliary: Layering density in the gallbladder consistent with small stones or milk of calcium. No gallbladder wall thickening or infiltration. No focal liver lesions. Pancreas: Unremarkable. No pancreatic ductal dilatation or surrounding inflammatory changes. Spleen: Normal in size without focal abnormality. Adrenals/Urinary Tract: Adrenal glands are unremarkable. Kidneys are normal, without renal calculi, focal lesion, or hydronephrosis. Bladder is decompressed, limiting evaluation but no focal lesions or stones are identified. Stomach/Bowel: Stomach is within normal limits. Appendix appears normal. No evidence of bowel wall thickening, distention, or inflammatory changes. Vascular/Lymphatic: No significant  vascular findings are present. No enlarged abdominal or pelvic lymph nodes. Reproductive: Uterus and bilateral adnexa are unremarkable. Other: No abdominal wall hernia or abnormality. No abdominopelvic ascites. Musculoskeletal: No acute or significant osseous findings. IMPRESSION: 1. Cholelithiasis with small  stones versus milk of calcium layering in the gallbladder. No inflammatory changes appreciated. 2. No evidence of bowel obstruction or inflammation. 3. No renal or ureteral stone or obstruction. Electronically Signed   By: Burman Nieves M.D.   On: 12/04/2021 18:11    Assessment and plan-  #Gross hematuria in the setting of patient with history of sickle cell trait. Cystoscopy findings were reviewed. 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  And Goody's powder [NSAIDs] use.  CT done in current admission did not show any renal lesions. I recommend bedrest, aggressive IV fluid hydration.  For her persistent gross hematuria, I will add Amicar 2g Q6 hours. I will check sickle cell disease confirmation testing.  Avoid NSAIDs.  I discontinued Toradol order. Treat infection.  In the future, recommend patient to follow-up with a tertiary center sickle cell program.  # Acute blood loss anemia, continue oral iron supplementation.  She is already on IV venofer.    Thank you for allowing me to participate in the care of this patient.    Rickard Patience, MD, PhD Hematology Oncology 12/09/2021

## 2021-12-09 NOTE — Anesthesia Preprocedure Evaluation (Addendum)
Anesthesia Evaluation  Patient identified by MRN, date of birth, ID band Patient awake    Reviewed: Allergy & Precautions, NPO status , Patient's Chart, lab work & pertinent test results  History of Anesthesia Complications Negative for: history of anesthetic complications  Airway Mallampati: II  TM Distance: >3 FB Neck ROM: Full    Dental  (+) Teeth Intact   Pulmonary neg pulmonary ROS, Current Smoker and Patient abstained from smoking.,    Pulmonary exam normal breath sounds clear to auscultation       Cardiovascular Exercise Tolerance: Good negative cardio ROS Normal cardiovascular exam Rhythm:Regular     Neuro/Psych  Headaches, negative neurological ROS  negative psych ROS   GI/Hepatic negative GI ROS, Neg liver ROS,   Endo/Other  negative endocrine ROS  Renal/GU negative Renal ROS     Musculoskeletal   Abdominal Normal abdominal exam  (+)   Peds negative pediatric ROS (+)  Hematology negative hematology ROS (+) Blood dyscrasia, anemia ,   Anesthesia Other Findings Past Medical History: 09/15/2016: Syphilis     Comment:  reactive RPR 1:8 No date: Trichomonas infection  Past Surgical History: No date: NO PAST SURGERIES  BMI    Body Mass Index: 22.34 kg/m      Reproductive/Obstetrics negative OB ROS                            Anesthesia Physical Anesthesia Plan  ASA: 2  Anesthesia Plan: General   Post-op Pain Management:    Induction: Intravenous  PONV Risk Score and Plan: 1 and Ondansetron and Dexamethasone  Airway Management Planned: LMA  Additional Equipment:   Intra-op Plan:   Post-operative Plan: Extubation in OR  Informed Consent: I have reviewed the patients History and Physical, chart, labs and discussed the procedure including the risks, benefits and alternatives for the proposed anesthesia with the patient or authorized representative who has indicated  his/her understanding and acceptance.       Plan Discussed with: CRNA and Surgeon  Anesthesia Plan Comments:         Anesthesia Quick Evaluation

## 2021-12-09 NOTE — Progress Notes (Signed)
[facility]  - [place] [facility]   PATIENT NAME: Crystal Short    MR#:  [id number]  DATE OF BIRTH:  [birth year]  SUBJECTIVE:  patient continues to have gross hematuria.  Complains of low back strain. No fever. No family at bedside. NPO for Urology procedure  VITALS:  Blood pressure (!) 146/88, pulse 70, temperature 98.1 F (36.7 C), temperature source Temporal, resp. rate 16, height 5\' 5"  (1.651 m), weight 60.9 kg, last menstrual period 11/04/2021, SpO2 100 %.  PHYSICAL EXAMINATION:   GENERAL:  31 y.o.-year-old patient lying in the bed with no acute distress.  LUNGS: Normal breath sounds bilaterally, no wheezing, rales, rhonchi.  CARDIOVASCULAR: S1, S2 normal. No murmurs, rubs, or gallops.  ABDOMEN: Soft, nontender, nondistended. Bowel sounds present.  EXTREMITIES: No  edema b/l.    NEUROLOGIC: nonfocal  patient is alert and awake SKIN: No obvious rash, lesion, or ulcer.   LABORATORY PANEL:  CBC Recent Labs  Lab 12/07/21 0714 12/08/21 1950  WBC 9.5  --   HGB 9.1* 9.6*  HCT 27.1*  --   PLT 502*  --      Chemistries  Recent Labs  Lab 12/05/21 0634  NA 141  K 3.8  CL 111  CO2 24  GLUCOSE 86  BUN 18  CREATININE 0.80  CALCIUM 9.3  AST 13*  ALT 11  ALKPHOS 39  BILITOT 1.1     Assessment and Plan  Crystal Short is a 31 y.o. female with medical history significant for syphilis, trichomoniasis infection, recent E. coli UTI/hemorrhagic cystitis pyelonephritis (initially treated with Rocephin and discharged on ciprofloxacin, completed antibiotics on 12/02/2021).  She presented to the hospital with complaints of fever, dysuria pain on both sides of her lower back, hematuria, dizziness, headache, nausea   She was admitted to the hospital for probable acute and gross hematuria leading to acute blood loss anemia. Denies vomiting blood  Acute blood loss anemia secondary to gross hematuria-- unclear etiology recent E. coli hemorrhagic cystitis--  completed antibiotic -- patient continues to have hematuria -- came in with hemoglobin of 8.5-- 7.8--two units blood transfusion -- 9.1--9.6 -- baseline hemoglobin 12.1 -- repeat urine culture this admission no growth. Discontinue antibiotic. Discussed with urology -- patient seen by urology. Plans for cystoscopy with bilateral retrograde bilateral pyelogram this Friday  Iron def anemia --s iron <5 --started IV venofer and po iron --no evidence of G.I. bleed. -- Dr. Friday was consulted initially no indication for any inpatient evaluation.     Family communication : none Consults : urology Dr. Timothy Lasso CODE STATUS: full DVT Prophylaxis : ambulation SCD Level of care: Med-Surg Status is: Inpatient Remains inpatient appropriate because: cystoscopy scheduled for today    TOTAL TIME TAKING CARE OF THIS PATIENT: 25 minutes.  >50% time spent on counselling and coordination of care  Note: This dictation was prepared with Dragon dictation along with smaller phrase technology. Any transcriptional errors that result from this process are unintentional.  Julien Nordmann M.D    [facility]   CC: Primary care physician; Patient, No Pcp Per

## 2021-12-09 NOTE — Plan of Care (Signed)
  Problem: Clinical Measurements: Goal: Diagnostic test results will improve Outcome: Not Progressing   Problem: Pain Managment: Goal: General experience of comfort will improve Outcome: Not Progressing   Problem: Elimination: Goal: Will not experience complications related to urinary retention Outcome: Not Progressing   Problem: Elimination: Goal: Will not experience complications related to bowel motility Outcome: Not Progressing

## 2021-12-10 ENCOUNTER — Encounter: Payer: Self-pay | Admitting: Urology

## 2021-12-10 DIAGNOSIS — N172 Acute kidney failure with medullary necrosis: Secondary | ICD-10-CM

## 2021-12-10 DIAGNOSIS — D649 Anemia, unspecified: Secondary | ICD-10-CM | POA: Diagnosis not present

## 2021-12-10 DIAGNOSIS — R310 Gross hematuria: Secondary | ICD-10-CM | POA: Diagnosis not present

## 2021-12-10 DIAGNOSIS — D573 Sickle-cell trait: Secondary | ICD-10-CM | POA: Diagnosis not present

## 2021-12-10 MED ORDER — MORPHINE SULFATE (PF) 2 MG/ML IV SOLN
2.0000 mg | INTRAVENOUS | Status: DC | PRN
  Administered 2021-12-10 – 2021-12-11 (×2): 2 mg via INTRAVENOUS
  Filled 2021-12-10 (×2): qty 1

## 2021-12-10 MED ORDER — MORPHINE SULFATE (PF) 2 MG/ML IV SOLN
2.0000 mg | Freq: Four times a day (QID) | INTRAVENOUS | Status: DC | PRN
  Administered 2021-12-10 (×2): 2 mg via INTRAVENOUS
  Filled 2021-12-10 (×2): qty 1

## 2021-12-10 NOTE — Progress Notes (Signed)
Mobility Specialist - Progress Note    12/10/21 1300  Mobility  Activity Ambulated independently to bathroom  Level of Assistance Modified independent, requires aide device or extra time  Assistive Device Other (Comment) (HHA)  Distance Ambulated (ft) 15 ft  Activity Response Tolerated well  $Mobility charge 1 Mobility    Pt lying in bed using RA upon arrival. Pt completes bed mobility ModI + extra time and ambulates ModI with HHA due to voiced 10/10 pain, but shows no significant signs of distress. Pt returns to bed with needs in reach.  Clarisa Schools Mobility Specialist 12/10/21, 1:27 PM

## 2021-12-10 NOTE — Plan of Care (Signed)
  Problem: Education: Goal: Knowledge of General Education information will improve Description: Including pain rating scale, medication(s)/side effects and non-pharmacologic comfort measures Outcome: Progressing   Problem: Activity: Goal: Risk for activity intolerance will decrease Outcome: Progressing   Problem: Nutrition: Goal: Adequate nutrition will be maintained Outcome: Progressing   Problem: Coping: Goal: Level of anxiety will decrease Outcome: Progressing   Problem: Elimination: Goal: Will not experience complications related to urinary retention Outcome: Progressing   Problem: Safety: Goal: Ability to remain free from injury will improve Outcome: Progressing

## 2021-12-10 NOTE — Progress Notes (Signed)
[facility]  - [place] at [facility]   PATIENT NAME: Crystal Short    MR#:  [id number]  DATE OF BIRTH:  May 16, 1990  SUBJECTIVE:  Urine now clear. Lingering abdominal pain Tolerating po diet VITALS:  Blood pressure 94/61, pulse (!) 58, temperature 98.5 F (36.9 C), temperature source Oral, resp. rate 16, height 5\' 5"  (1.651 m), weight 60.9 kg, last menstrual period 11/04/2021, SpO2 100 %.  PHYSICAL EXAMINATION:   GENERAL:  31 y.o.-year-old patient lying in the bed with no acute distress.  LUNGS: Normal breath sounds bilaterally, no wheezing, rales, rhonchi.  CARDIOVASCULAR: S1, S2 normal. No murmurs, rubs, or gallops.  ABDOMEN: Soft, nontender, nondistended. Bowel sounds present.  EXTREMITIES: No  edema b/l.    NEUROLOGIC: nonfocal  patient is alert and awake SKIN: No obvious rash, lesion, or ulcer.   LABORATORY PANEL:  CBC Recent Labs  Lab 12/07/21 0714 12/08/21 1950  WBC 9.5  --   HGB 9.1* 9.6*  HCT 27.1*  --   PLT 502*  --      Chemistries  Recent Labs  Lab 12/05/21 0634  NA 141  K 3.8  CL 111  CO2 24  GLUCOSE 86  BUN 18  CREATININE 0.80  CALCIUM 9.3  AST 13*  ALT 11  ALKPHOS 39  BILITOT 1.1     Assessment and Plan  Aeron Lheureux is a 31 y.o. female with medical history significant for syphilis, trichomoniasis infection, recent E. coli UTI/hemorrhagic cystitis pyelonephritis (initially treated with Rocephin and discharged on ciprofloxacin, completed antibiotics on 12/02/2021).  She presented to the hospital with complaints of fever, dysuria pain on both sides of her lower back, hematuria, dizziness, headache, nausea   She was admitted to the hospital for probable acute and gross hematuria leading to acute blood loss anemia. 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 Discontinue antibiotic.  -- patient seen by urology --s/p  cystoscopy with bilateral retrograde bilateral pyelogram  Intraoperative findings:  97ml old clot evacuated from bladder.  Thorough cystoscopy showed no suspicious bladder lesions, ureteral orifices orthotopic bilaterally Frankly bloody efflux from left ureter, right ureteral efflux clear Bilateral retrograde pyelograms normal Left diagnostic ureteroscopy with no ureteral abnormalities, subtle petechiae and oozing from multiple renal papilla, bleeding areas fulgurated Clear efflux from left ureteral orifice at conclusion of procedure, no stent placed  --seen by Dr yu--started on Amicar 2g q6 and IVF. Sickle cell dz test sent out  Iron def anemia --s iron <5 --received  IV venofer and po iron --no evidence of G.I. bleed. -- Dr. 32m was consulted initially no indication for any inpatient evaluation.     Family communication : aunt Consults : urology Dr. Timothy Lasso CODE STATUS: full DVT Prophylaxis : ambulation SCD Level of care: Med-Surg Status is: Inpatient Remains inpatient appropriate because: ongoing rx for gross hematuria    TOTAL TIME TAKING CARE OF THIS PATIENT: 35 minutes.  >50% time spent on counselling and coordination of care  Note: This dictation was prepared with Dragon dictation along with smaller phrase technology. Any transcriptional errors that result from this process are unintentional.  Julien Nordmann M.D    [facility]   CC: Primary care physician; Patient, No Pcp Per

## 2021-12-11 ENCOUNTER — Inpatient Hospital Stay: Payer: Medicaid Other

## 2021-12-11 DIAGNOSIS — N172 Acute kidney failure with medullary necrosis: Secondary | ICD-10-CM | POA: Diagnosis not present

## 2021-12-11 DIAGNOSIS — R310 Gross hematuria: Secondary | ICD-10-CM

## 2021-12-11 DIAGNOSIS — R1032 Left lower quadrant pain: Secondary | ICD-10-CM

## 2021-12-11 DIAGNOSIS — D649 Anemia, unspecified: Secondary | ICD-10-CM | POA: Diagnosis not present

## 2021-12-11 DIAGNOSIS — D573 Sickle-cell trait: Secondary | ICD-10-CM | POA: Diagnosis not present

## 2021-12-11 LAB — BASIC METABOLIC PANEL
Anion gap: 3 — ABNORMAL LOW (ref 5–15)
BUN: 11 mg/dL (ref 6–20)
CO2: 24 mmol/L (ref 22–32)
Calcium: 8.8 mg/dL — ABNORMAL LOW (ref 8.9–10.3)
Chloride: 111 mmol/L (ref 98–111)
Creatinine, Ser: 1.42 mg/dL — ABNORMAL HIGH (ref 0.44–1.00)
GFR, Estimated: 51 mL/min — ABNORMAL LOW (ref >60)
Glucose, Bld: 92 mg/dL (ref 70–99)
Potassium: 4.2 mmol/L (ref 3.5–5.1)
Sodium: 138 mmol/L (ref 135–145)

## 2021-12-11 LAB — WET PREP, GENITAL
Sperm: NONE SEEN
Trich, Wet Prep: NONE SEEN
WBC, Wet Prep HPF POC: >=10 — AB (ref <10)
Yeast Wet Prep HPF POC: NONE SEEN

## 2021-12-11 LAB — CBC
HCT: 28.5 % — ABNORMAL LOW (ref 36.0–46.0)
Hemoglobin: 9.2 g/dL — ABNORMAL LOW (ref 12.0–15.0)
MCH: 26.7 pg (ref 26.0–34.0)
MCHC: 32.3 g/dL (ref 30.0–36.0)
MCV: 82.6 fL (ref 80.0–100.0)
Platelets: 444 10*3/uL — ABNORMAL HIGH (ref 150–400)
RBC: 3.45 MIL/uL — ABNORMAL LOW (ref 3.87–5.11)
RDW: 16 % — ABNORMAL HIGH (ref 11.5–15.5)
WBC: 12.7 10*3/uL — ABNORMAL HIGH (ref 4.0–10.5)
nRBC: 0 % (ref 0.0–0.2)

## 2021-12-11 MED ORDER — SODIUM CHLORIDE 0.9 % IV SOLN: 12.5000 mg | Freq: Once | INTRAVENOUS | Status: DC

## 2021-12-11 MED ORDER — HYDROCODONE-ACETAMINOPHEN 7.5-325 MG PO TABS
1.0000 | ORAL_TABLET | Freq: Four times a day (QID) | ORAL | Status: DC | PRN
  Administered 2021-12-11 – 2021-12-12 (×3): 1 via ORAL
  Filled 2021-12-11 (×4): qty 1

## 2021-12-11 MED ORDER — SODIUM CHLORIDE 0.9 % IV SOLN: 12.5000 mg | Freq: Three times a day (TID) | INTRAVENOUS | Status: DC | PRN

## 2021-12-11 MED ORDER — TAMSULOSIN HCL 0.4 MG PO CAPS
0.4000 mg | ORAL_CAPSULE | Freq: Every day | ORAL | Status: DC
  Administered 2021-12-11 – 2021-12-12 (×2): 0.4 mg via ORAL
  Filled 2021-12-11 (×2): qty 1

## 2021-12-11 MED ORDER — MORPHINE SULFATE (PF) 2 MG/ML IV SOLN
1.0000 mg | INTRAVENOUS | Status: DC | PRN
  Administered 2021-12-11 – 2021-12-12 (×3): 2 mg via INTRAVENOUS
  Filled 2021-12-11 (×3): qty 1

## 2021-12-11 MED ORDER — METRONIDAZOLE 500 MG PO TABS
2000.0000 mg | ORAL_TABLET | Freq: Once | ORAL | Status: DC
Start: 2021-12-11 — End: 2021-12-11
  Filled 2021-12-11: qty 4

## 2021-12-11 MED ORDER — LIDOCAINE 5 % EX PTCH
1.0000 | MEDICATED_PATCH | CUTANEOUS | Status: DC
  Administered 2021-12-11 – 2021-12-12 (×2): 1 via TRANSDERMAL
  Filled 2021-12-11 (×2): qty 1

## 2021-12-11 MED ORDER — MORPHINE SULFATE (PF) 2 MG/ML IV SOLN
1.0000 mg | Freq: Four times a day (QID) | INTRAVENOUS | Status: DC | PRN
  Administered 2021-12-11: 2 mg via INTRAVENOUS
  Filled 2021-12-11: qty 1

## 2021-12-11 MED ORDER — METRONIDAZOLE 500 MG PO TABS
500.0000 mg | ORAL_TABLET | Freq: Two times a day (BID) | ORAL | Status: DC
  Administered 2021-12-11 – 2021-12-12 (×3): 500 mg via ORAL
  Filled 2021-12-11 (×4): qty 1

## 2021-12-11 NOTE — Progress Notes (Signed)
[facility]  - [place] at [facility]   PATIENT NAME: Crystal Short    MR#:  [id number]  DATE OF BIRTH:  Sep 27, 1990  SUBJECTIVE:  Urine now clear.\Patient very tearful. She has significant left flank pain. Has vomited in the morning couple times. No family at bedside. VITALS:  Blood pressure 103/61, pulse 68, temperature 98.5 F (36.9 C), resp. rate 20, height 5\' 5"  (1.651 m), weight 60.9 kg, last menstrual period 11/04/2021, SpO2 100 %.  PHYSICAL EXAMINATION:   GENERAL:  31 y.o.-year-old patient lying in the bed with acute distress.  LUNGS: Normal breath sounds bilaterally, no wheezing, rales, rhonchi.  CARDIOVASCULAR: S1, S2 normal. No murmurs, rubs, or gallops.  ABDOMEN: Soft, nontender, nondistended. Bowel sounds present.  EXTREMITIES: No  edema b/l.    NEUROLOGIC: nonfocal  patient is alert and awake SKIN: No obvious rash, lesion, or ulcer.   LABORATORY PANEL:  CBC Recent Labs  Lab 12/11/21 1127  WBC 12.7*  HGB 9.2*  HCT 28.5*  PLT 444*     Chemistries  Recent Labs  Lab 12/05/21 0634 12/11/21 1127  NA 141 138  K 3.8 4.2  CL 111 111  CO2 24 24  GLUCOSE 86 92  BUN 18 11  CREATININE 0.80 1.42*  CALCIUM 9.3 8.8*  AST 13*  --   ALT 11  --   ALKPHOS 39  --   BILITOT 1.1  --      Assessment and Plan  Crystal Short is a 31 y.o. female with medical history significant for syphilis, trichomoniasis infection, recent E. coli UTI/hemorrhagic cystitis pyelonephritis (initially treated with Rocephin and discharged on ciprofloxacin, completed antibiotics on 12/02/2021).  She presented to the hospital with complaints of fever, dysuria pain on both sides of her lower back, hematuria, dizziness, headache, nausea   She was admitted to the hospital for probable acute and gross hematuria leading to acute blood loss anemia. 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 Discontinue antibiotic.  -- patient seen by urology --s/p  cystoscopy with bilateral retrograde bilateral pyelogram  Intraoperative findings:  46ml old clot evacuated from bladder.  Thorough cystoscopy showed no suspicious bladder lesions, ureteral orifices orthotopic bilaterally Frankly bloody efflux from left ureter, right ureteral efflux clear Bilateral retrograde pyelograms normal Left diagnostic ureteroscopy with no ureteral abnormalities, subtle petechiae and oozing from multiple renal papilla, bleeding areas fulgurated Clear efflux from left ureteral orifice at conclusion of procedure, no stent placed  --seen by Dr yu--started on Amicar 2g q6 and IVF. Sickle cell dz test sent out --continues significant left flank pain. Patient very tearful. Discussed with urology Dr. 32m and recommended renal ultrasound. -- Renal ultrasound showsFindings are concerning for a small stone and debris in the prominent proximal left ureter with new mild left hydronephrosis There also appears to be a 6 mm stone in the left kidney. The perinephric stranding/fluid on the left is likely reactive. 2. The right kidney is unremarkable. 3. The bladder wall is prominent. The bladder is otherwise normal. However, a left ureteral jet is not noted. -- Will keep her NPO after midnight. Patient will likely undergo cystoscopy with possible left ureteral stent placement. She is aware of it.   Iron def anemia --s  iron <5 --received  IV venofer and po iron --no evidence of G.I. bleed. -- Dr. Timothy Lasso was consulted initially no indication for any inpatient evaluation.  Intractable vomiting suspected due to significant pain -- PRN Zofran and will add PRN Phenergan -- patient advised to take ice  chips.   Family communication : aunt Consults : urology Dr. Wendi Snipes Sninsky/Dr Eskridge CODE STATUS: full DVT Prophylaxis : ambulation SCD Level of care: Med-Surg Status is: Inpatient Remains inpatient appropriate because: ongoing rx for gross hematuria    TOTAL TIME TAKING CARE OF THIS PATIENT: 35 minutes.  >50% time spent on counselling and coordination of care  Note: This dictation was prepared with Dragon dictation along with smaller phrase technology. Any transcriptional errors that result from this process are unintentional.  Enedina Finner M.D    [facility]   CC: Primary care physician; Patient, No Pcp Per

## 2021-12-11 NOTE — Plan of Care (Signed)
Patient continues to have high levels of pain to LLQ and lower left back, provider aware and has ordered meds, imaging and heat therapy. Patient c/o vaginal discharge today wet prep performed and sent to lab with positive results refer to labs, provider aware and treating patient as appropriate. Patient alert and oriented, independent. Awaiting imaging results.  Problem: Education: Goal: Knowledge of General Education information will improve Description: Including pain rating scale, medication(s)/side effects and non-pharmacologic comfort measures Outcome: Progressing   Problem: Health Behavior/Discharge Planning: Goal: Ability to manage health-related needs will improve Outcome: Progressing   Problem: Clinical Measurements: Goal: Ability to maintain clinical measurements within normal limits will improve Outcome: Progressing Goal: Will remain free from infection Outcome: Progressing Goal: Diagnostic test results will improve Outcome: Progressing Goal: Respiratory complications will improve Outcome: Progressing Goal: Cardiovascular complication will be avoided Outcome: Progressing   Problem: Activity: Goal: Risk for activity intolerance will decrease Outcome: Progressing   Problem: Nutrition: Goal: Adequate nutrition will be maintained Outcome: Progressing   Problem: Coping: Goal: Level of anxiety will decrease Outcome: Progressing   Problem: Elimination: Goal: Will not experience complications related to bowel motility Outcome: Progressing Goal: Will not experience complications related to urinary retention Outcome: Progressing   Problem: Pain Managment: Goal: General experience of comfort will improve Outcome: Not Progressing   Problem: Safety: Goal: Ability to remain free from injury will improve Outcome: Progressing   Problem: Skin Integrity: Goal: Risk for impaired skin integrity will decrease Outcome: Progressing

## 2021-12-11 NOTE — Progress Notes (Addendum)
Attempted to see patient today.  She is off the floor for ultrasound.

## 2021-12-11 NOTE — Progress Notes (Signed)
2 Days Post-Op Subjective: C/o left lower quadrant abdominal pain, controlled. Reports nausea. Afebrile. Voiding clear yellow urine.  Objective: Vital signs in last 24 hours: Temp:  [98.2 F (36.8 C)-98.5 F (36.9 C)] 98.5 F (36.9 C) (07/30 0502) Pulse Rate:  [58-68] 68 (07/30 0502) Resp:  [16-20] 20 (07/30 0502) BP: (94-103)/(60-64) 103/61 (07/30 0502) SpO2:  [100 %] 100 % (07/30 0502)  Intake/Output from previous day: 07/29 0701 - 07/30 0700 In: 2892 [P.O.:240; I.V.:2542; IV Piggyback:110] Out: -  Intake/Output this shift: Total I/O In: 1311.6 [P.O.:240; I.V.:1071.6] Out: -    Physical Exam:  General: Alert and oriented CV: RRR Lungs: Clear Abdomen: Soft, ND, NT Ext: NT, No erythema  Lab Results: Recent Labs    12/08/21 1950  HGB 9.6*   BMET No results for input(s): "NA", "K", "CL", "CO2", "GLUCOSE", "BUN", "CREATININE", "CALCIUM" in the last 72 hours.   Studies/Results: DG OR UROLOGY CYSTO IMAGE ([facility] ONLY)  Result Date: 12/09/2021 There is no interpretation for this exam.  This order is for images obtained during a surgical procedure.  Please See "Surgeries" Tab for more information regarding the procedure.    Assessment/Plan: Gross hematuria likely from sickle cell trait and recent left sided pyelonephritis s/p cysto, left URS/fulguration of renal papilla on 12/09/2021  -Hematuria resolved. Reports she is voiding clear yellow urine. No need for foley catheter -Following peripherally. Please call with questions.   LOS: 6 days   Matt R. Tobiah Celestine MD 12/11/2021, 5:19 AM [facility]  Pager: [phone]

## 2021-12-11 NOTE — Progress Notes (Addendum)
Patient with severe left flank pain today. She has also had N/V. She ate a few bites of potato and pot roast around 2 pm and then vomited again.   Vitals:   12/10/21 2000 12/11/21 0502  BP: 98/60 103/61  Pulse: 60 68  Resp: 20 20  Temp: 98.5 F (36.9 C) 98.5 F (36.9 C)  SpO2: 100% 100%   Her Hgb looks stable at 9.2 but Cr up to 1.42. Renal US was obtained which shows some mild left hydronephrosis and dilation of the left proximal ureter and echogenic material in the kidney and left proximal ureter (5 mm).   She is likely passing a small clot and or tissue/debris related to her recent left renal papillary bleeding and ureteroscopy, fulguration procedure, but could also have some hydro from the ureteroscopy.   I called and spoke to anesthesia and we would need to wait the full 8 hours prior to any anesthesia unless it is urgent.   I called and spoke to the patient and explained the US findings. We also discussed the nature r/b/a to a cysto and left ureteral stent. The pain and nausea meds are helping. Her flank pain is better and she has some discomfort down in the left side of her abdomen.   Given that she is AFVSS and that the pain and nausea meds are helping, Dr. Allena Katz will convert her to ice chips and NPO p MN for possible left stent tomorrow. Also, she will start tamsulosin.   I communicated with Dr. Allena Katz and Nurse Sue Lush. Hopefully, she will pass this material similar to passing a stone. It seems in talking with the patient things are moving along.   Addendum: Loula was seen and examined on rounds this evening. She is doing better. Tolerated clears. Some residual "stomach pain" but she is resting comfortably. No flank pain.   Keep NPO for poss stent tomorrow and BMP ordered.

## 2021-12-12 ENCOUNTER — Telehealth: Payer: Self-pay

## 2021-12-12 DIAGNOSIS — N12 Tubulo-interstitial nephritis, not specified as acute or chronic: Secondary | ICD-10-CM | POA: Diagnosis not present

## 2021-12-12 DIAGNOSIS — D649 Anemia, unspecified: Secondary | ICD-10-CM

## 2021-12-12 DIAGNOSIS — D573 Sickle-cell trait: Secondary | ICD-10-CM | POA: Diagnosis not present

## 2021-12-12 DIAGNOSIS — R310 Gross hematuria: Secondary | ICD-10-CM | POA: Diagnosis not present

## 2021-12-12 LAB — BASIC METABOLIC PANEL
Anion gap: 6 (ref 5–15)
BUN: 8 mg/dL (ref 6–20)
CO2: 23 mmol/L (ref 22–32)
Calcium: 9.2 mg/dL (ref 8.9–10.3)
Chloride: 109 mmol/L (ref 98–111)
Creatinine, Ser: 1.01 mg/dL — ABNORMAL HIGH (ref 0.44–1.00)
GFR, Estimated: >60 mL/min (ref >60)
Glucose, Bld: 103 mg/dL — ABNORMAL HIGH (ref 70–99)
Potassium: 3.8 mmol/L (ref 3.5–5.1)
Sodium: 138 mmol/L (ref 135–145)

## 2021-12-12 MED ORDER — METRONIDAZOLE 500 MG PO TABS: 500.0000 mg | ORAL_TABLET | Freq: Two times a day (BID) | ORAL | 0 refills | Status: AC

## 2021-12-12 MED ORDER — FERROUS SULFATE 325 (65 FE) MG PO TABS: 325.0000 mg | ORAL_TABLET | Freq: Two times a day (BID) | ORAL | 3 refills | Status: DC

## 2021-12-12 MED ORDER — TAMSULOSIN HCL 0.4 MG PO CAPS: 0.4000 mg | ORAL_CAPSULE | Freq: Every day | ORAL | 0 refills | Status: DC

## 2021-12-12 MED ORDER — HYDROCODONE-ACETAMINOPHEN 7.5-325 MG PO TABS: 1.0000 | ORAL_TABLET | Freq: Three times a day (TID) | ORAL | 0 refills | Status: DC | PRN

## 2021-12-12 NOTE — Telephone Encounter (Signed)
Please schedule patient for Lab/MD in 2-3 weeks. Hospital follow up. Please notify patient of appt. Thanks

## 2021-12-12 NOTE — Progress Notes (Signed)
   Subjective Severe left flank pain and nausea vomiting yesterday afternoon, no issues overnight and denies complaint this morning Urine remains clear yellow  Physical Exam: BP 106/61 (BP Location: Right Arm)   Pulse 64   Temp 98.6 F (37 C) (Oral)   Resp 18   Ht 5\' 5"  (1.651 m)   Wt 60.9 kg   LMP 11/04/2021 (Exact Date) Comment: Urine HCG 12/04/2021 Negative  SpO2 100%   BMI 22.34 kg/m    Constitutional:  Alert and oriented, No acute distress. Respiratory: Normal respiratory effort, no increased work of breathing. 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  Gross hematuria has since resolved, and hematocrit stable.  On 12/11/2021 he developed acute onset of left-sided flank pain and nausea vomiting, with renal ultrasound showing mild left hydronephrosis with some possible debris in the left proximal ureter likely related to recent papillary necrosis.  Pain has resolved overnight and no complaints this morning, renal function normalized.  We discussed options at length including continued observation with pain control as needed and continuing Flomax to help with passage of any residual debris, versus cystoscopy and left ureteral stent placement.  Renal function has normalized and asymptomatic this morning, and strongly recommend continuing observation.  Risk of ureteral stent include reaggravated gross hematuria as well as stent related symptoms including flank pain, urgency/frequency, and dysuria.  Appreciate hematology recs regarding sickle cell trait  -If continues to do well this morning, okay to resume general diet -Urology will continue to follow  I spent 25 total minutes on the  floor with >50% spent face-to-face with the patient regarding sickle cell trait, renal papillary necrosis, likely passage of debris on the left side, normalized renal function, and options for observation or left ureteral stent placement.   12/13/2021, MD

## 2021-12-12 NOTE — Telephone Encounter (Signed)
I see patient was scheduled with Dr. Cathie Hoops on 8/15 but she will also need lab appt as well.

## 2021-12-12 NOTE — Discharge Summary (Addendum)
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 female with medical history significant for syphilis, trichomoniasis infection, recent E. coli UTI/hemorrhagic cystitis pyelonephritis (initially treated with Rocephin and discharged on ciprofloxacin, completed antibiotics on 12/02/2021).  She presented to the hospital with complaints of fever, dysuria pain on both sides of her lower back, hematuria, dizziness, headache, nausea   She was admitted to the hospital for probable acute and gross hematuria leading to acute blood loss anemia. 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 Discontinue antibiotic.  -- patient seen by urology --s/p  cystoscopy with bilateral retrograde bilateral pyelogram  Intraoperative findings:  14ml old clot evacuated from bladder.  Thorough cystoscopy showed no suspicious bladder lesions, ureteral orifices orthotopic bilaterally Frankly bloody efflux from left ureter, right ureteral efflux clear Bilateral retrograde pyelograms normal Left diagnostic ureteroscopy with no ureteral abnormalities, subtle petechiae and oozing from multiple renal papilla, bleeding areas fulgurated Clear  efflux from left ureteral orifice at conclusion of procedure, no stent placed  --seen by Dr yu--started on Amicar 2g q6 and IVF. Sickle cell dz test sent out --continues significant left flank pain. Patient very tearful. Discussed with urology Dr. Mena Goes and recommended renal ultrasound. -- Renal ultrasound showsFindings are concerning for a small stone and debris in the prominent proximal left ureter with new mild left hydronephrosis There also appears to be a 6 mm stone in the left kidney. The perinephric stranding/fluid on the left is likely reactive. 2. The right kidney is unremarkable. 3. The bladder wall is prominent. The bladder is otherwise normal. However, a left ureteral jet is not noted. -- Will keep her NPO after midnight. Patient will likely undergo cystoscopy with possible left ureteral stent placement. She is aware of it.  --7/31--doing well abdominal pain much improved. Tolerated PO diet. Plans for cystoscopy with stent placement canceled since patient clinically improved and her creatinine improved as well. Likely she passed the debris material. Will continue PO Flomax.   Iron def anemia --s iron <5 --received  IV venofer and po iron --no evidence of G.I. bleed. -- Dr. Timothy Lasso was consulted initially no indication for any inpatient evaluation.  Bacterial vaginosis on Wet prep --flagyl for 7 days   Intractable vomiting suspected due to significant pain -- PRN Zofran and will add PRN Phenergan -- patient advised to take ice chips. --resolved--tolerating po diet today  patient will discharged to home. Discharge was discussed with patient and her and Cordelia Pen     Family communication : aunt sherry Consults : urology Dr. Wendi Snipes Sninsky/Dr Mena Goes, Hematology Dr Cathie Hoops CODE STATUS: full DVT Prophylaxis : ambulation SCD     Pain control - [facility] Controlled Substance Reporting System database was reviewed. and patient  was instructed, not to drive, operate heavy  machinery, perform activities at heights, swimming or participation in water activities or provide baby-sitting services while on Pain, Sleep and Anxiety Medications; until their outpatient Physician has advised to do so again. Also recommended to not to take more than prescribed Pain, Sleep and Anxiety Medications.  Disposition: Home Diet recommendation:  Discharge Diet Orders (From admission, onward)     Start     Ordered   12/12/21 0000  Diet general        12/12/21 1504           Regular diet DISCHARGE MEDICATION: Allergies as of 12/12/2021       Reactions   Orange Fruit [citrus] Swelling   Swelling is of the tongue.        Medication List     STOP taking these medications    ciprofloxacin 500 MG tablet Commonly known as: CIPRO   oxyCODONE-acetaminophen 5-325 MG tablet Commonly known as: PERCOCET/ROXICET       TAKE these medications    ferrous sulfate 325 (65 FE) MG tablet Take 1 tablet (325 mg total) by mouth 2 (two) times daily with a meal.   HYDROcodone-acetaminophen 7.5-325 MG tablet Commonly known as: NORCO Take 1 tablet by mouth every 8 (eight) hours as needed for moderate pain or severe pain.   metroNIDAZOLE 500 MG tablet Commonly known as: FLAGYL Take 1 tablet (500 mg total) by mouth every 12 (twelve) hours for 6 days.   tamsulosin 0.4 MG Caps capsule Commonly known as: FLOMAX Take 1 capsule (0.4 mg total) by mouth daily. Start taking on: December 13, 2021        Follow-up Information     Sondra Come, MD. Schedule an appointment as soon as possible for a visit in 2 week(s).   Specialty: Urology Why: hematuria Contact information: [address] [place] [address] [phone]         Rickard Patience, MD. Schedule an appointment as soon as possible for a visit in 2 week(s).   Specialty: Oncology Why: Sickle cell trait/hematuria Contact information: [address] [place] [address] [phone]                 Discharge Exam: Ceasar Mons Weights   12/08/21 0406 12/09/21 0957  Weight: 60.9 kg 60.9 kg     Condition at discharge: fair  The results of significant diagnostics from this hospitalization (including imaging, microbiology, ancillary and laboratory) are listed below for reference.   Imaging Studies: US RENAL  Result Date: 12/11/2021 CLINICAL DATA:  Abdominal pain for 3 days EXAM: RENAL / URINARY TRACT ULTRASOUND COMPLETE COMPARISON:  CT scan of the abdomen and ultrasound of the kidneys December 04, 2021 FINDINGS: Right Kidney: Renal measurements: 10.4 x 5.1 x 4.1 cm = volume: 115 mL. Echogenicity within normal limits. No mass or hydronephrosis visualized. Left Kidney: Renal measurements: 11.3 x 6.3 x 5.9 cm = volume: 220 mL. Mild hydronephrosis. Probable small stone in the mid kidney. Possible small stone in the prominent proximal left ureter. There may also be debris in the adjacent ureter. Perinephric fluid is identified on the left. Bladder: The bladder wall is prominent in caliber. The bladder is otherwise normal. No left ureteral jet noted. Other: None. IMPRESSION: 1. Findings are concerning for a small stone and debris in the prominent proximal left ureter with new mild left hydronephrosis. CT imaging could better evaluate. There also appears to be a 6 mm stone in the left kidney.  The perinephric stranding/fluid on the left is likely reactive. 2. The right kidney is unremarkable. 3. The bladder wall is prominent. The bladder is otherwise normal. However, a left ureteral jet is not noted. Electronically Signed   By: Gerome Sam III M.D.   On: 12/11/2021 13:12   DG OR UROLOGY CYSTO IMAGE ([facility] ONLY)  Result Date: 12/09/2021 There is no interpretation for this exam.  This order is for images obtained during a surgical procedure.  Please See "Surgeries" Tab for more information regarding the procedure.   MR BRAIN WO CONTRAST  Result Date: 12/05/2021 CLINICAL DATA:  Headache and blurry vision  EXAM: MRI HEAD WITHOUT CONTRAST TECHNIQUE: Multiplanar, multiecho pulse sequences of the brain and surrounding structures were obtained without intravenous contrast. COMPARISON:  None Available. FINDINGS: Brain: No acute infarct, mass effect or extra-axial collection. No acute or chronic hemorrhage. Normal white matter signal, parenchymal volume and CSF spaces. The midline structures are normal. Vascular: Major flow voids are preserved. Skull and upper cervical spine: Normal calvarium and skull base. Visualized upper cervical spine and soft tissues are normal. Sinuses/Orbits:No paranasal sinus fluid levels or advanced mucosal thickening. No mastoid or middle ear effusion. Normal orbits. IMPRESSION: Normal brain MRI. Electronically Signed   By: Deatra Robinson M.D.   On: 12/05/2021 01:40   US Renal  Result Date: 12/04/2021 CLINICAL DATA:  Gross hematuria. EXAM: RENAL / URINARY TRACT ULTRASOUND COMPLETE COMPARISON:  None Available. FINDINGS: Right Kidney: Renal measurements: 9.8 cm x 3.2 cm x 4.2 cm = volume: 70.2 mL. Echogenicity within normal limits. No mass or hydronephrosis visualized. Left Kidney: Renal measurements: 10.0 cm x 5.6 cm x 4.3 cm = volume: 123 mL. Echogenicity within normal limits. No mass or hydronephrosis visualized. Bladder: Poorly distended and subsequently limited in evaluation. Other: None. IMPRESSION: Limited visualization of the urinary bladder, as described above, with normal ultrasonographic appearance of kidneys. Electronically Signed   By: Aram Candela M.D.   On: 12/04/2021 23:50   CT ABDOMEN PELVIS WO CONTRAST  Result Date: 12/04/2021 CLINICAL DATA:  Pelvic pain. Negative beta HCG. Hematuria. Epigastric pain. Hematuria and hematemesis for 3 weeks. Patient has been treated with antibiotics for kidney infection for 2 weeks. Syncopal episodes for 3 weeks. EXAM: CT ABDOMEN AND PELVIS WITHOUT CONTRAST TECHNIQUE: Multidetector CT imaging of the abdomen and pelvis was performed  following the standard protocol without IV contrast. RADIATION DOSE REDUCTION: This exam was performed according to the departmental dose-optimization program which includes automated exposure control, adjustment of the mA and/or kV according to patient size and/or use of iterative reconstruction technique. COMPARISON:  08/26/2016 FINDINGS: Lower chest: Lung bases are clear. Hepatobiliary: Layering density in the gallbladder consistent with small stones or milk of calcium. No gallbladder wall thickening or infiltration. No focal liver lesions. Pancreas: Unremarkable. No pancreatic ductal dilatation or surrounding inflammatory changes. Spleen: Normal in size without focal abnormality. Adrenals/Urinary Tract: Adrenal glands are unremarkable. Kidneys are normal, without renal calculi, focal lesion, or hydronephrosis. Bladder is decompressed, limiting evaluation but no focal lesions or stones are identified. Stomach/Bowel: Stomach is within normal limits. Appendix appears normal. No evidence of bowel wall thickening, distention, or inflammatory changes. Vascular/Lymphatic: No significant vascular findings are present. No enlarged abdominal or pelvic lymph nodes. Reproductive: Uterus and bilateral adnexa are unremarkable. Other: No abdominal wall hernia or abnormality. No abdominopelvic ascites. Musculoskeletal: No acute or significant osseous findings. IMPRESSION: 1. Cholelithiasis with small stones versus milk of calcium layering in the gallbladder. No inflammatory changes appreciated. 2. No  evidence of bowel obstruction or inflammation. 3. No renal or ureteral stone or obstruction. Electronically Signed   By: Burman Nieves M.D.   On: 12/04/2021 18:11    Microbiology: Results for orders placed or performed during the hospital encounter of 12/04/21  Urine Culture     Status: None   Collection Time: 12/04/21  3:47 PM   Specimen: Urine, Clean Catch  Result Value Ref Range Status   Specimen Description   Final     URINE, CLEAN CATCH Performed at [facility], [address], [place], [place] [address]    Special Requests   Final    NONE Performed at [facility], [address], [place], [place] [address]    Culture   Final    NO GROWTH Performed at [facility], [address] [place], [place] [address]    Report Status 12/06/2021 FINAL  Final  Wet prep, genital     Status: Abnormal   Collection Time: 12/11/21  9:31 AM  Result Value Ref Range Status   Yeast Wet Prep HPF POC NONE SEEN NONE SEEN Final   Trich, Wet Prep NONE SEEN NONE SEEN Final   Clue Cells Wet Prep HPF POC PRESENT (A) NONE SEEN Final   WBC, Wet Prep HPF POC >=10 (A) <10 Final   Sperm NONE SEEN  Final    Comment: Performed at [facility], [address] [place], [place] [address]    Labs: CBC: Recent Labs  Lab 12/06/21 0617 12/07/21 0714 12/08/21 1950 12/11/21 1127  WBC 6.0 9.5  --  12.7*  HGB 7.8* 9.1* 9.6* 9.2*  HCT 23.0* 27.1*  --  28.5*  MCV 79.9* 81.1  --  82.6  PLT 488* 502*  --  444*   Basic Metabolic Panel: Recent Labs  Lab 12/11/21 1127 12/12/21 0641  NA 138 138  K 4.2 3.8  CL 111 109  CO2 24 23  GLUCOSE 92 103*  BUN 11 8  CREATININE 1.42* 1.01*  CALCIUM 8.8* 9.2     Discharge time spent: greater than 30 minutes.  Signed: Enedina Finner, MD [facility] 12/12/2021

## 2021-12-12 NOTE — Telephone Encounter (Signed)
-----   Message from Rickard Patience, MD sent at 12/12/2021  3:18 PM EDT ----- Please schedule her to see me in 2-3 weeks. Hospital follow up Lab MD - check cbc iron tibc ferritin hold tube  zy

## 2021-12-12 NOTE — Plan of Care (Signed)

## 2021-12-12 NOTE — Progress Notes (Signed)
Mobility Specialist - Progress Note      12/12/21 1135  Mobility  Activity Ambulated independently in hallway  Level of Assistance Independent  Assistive Device None  Distance Ambulated (ft) 14 ft  Activity Response Tolerated well  $Mobility charge 1 Mobility   Pt supine upon entry. Pt utilizing RA. Pt independently transferred to EOB and stood up. Pt ambulated a half circle around NS. Pt left supine with needs in reach. No complaints.   Zetta Bills Mobility Specialist 12/12/21 11:40 AM

## 2021-12-13 LAB — HGB SOLUBILITY: Hgb Solubility: POSITIVE — AB

## 2021-12-13 LAB — HGB FRACTIONATION CASCADE
Hgb A2: 2.8 % (ref 1.8–3.2)
Hgb A: 69.2 % — ABNORMAL LOW (ref 96.4–98.8)
Hgb F: 0 % (ref 0.0–2.0)
Hgb S: 28 % — ABNORMAL HIGH

## 2021-12-27 ENCOUNTER — Telehealth: Payer: Self-pay

## 2021-12-27 ENCOUNTER — Inpatient Hospital Stay: Payer: Medicaid Other

## 2021-12-27 ENCOUNTER — Inpatient Hospital Stay: Payer: Medicaid Other | Attending: Oncology | Admitting: Oncology

## 2021-12-27 DIAGNOSIS — Z836 Family history of other diseases of the respiratory system: Secondary | ICD-10-CM | POA: Insufficient documentation

## 2021-12-27 DIAGNOSIS — Z8249 Family history of ischemic heart disease and other diseases of the circulatory system: Secondary | ICD-10-CM | POA: Insufficient documentation

## 2021-12-27 DIAGNOSIS — Z832 Family history of diseases of the blood and blood-forming organs and certain disorders involving the immune mechanism: Secondary | ICD-10-CM | POA: Insufficient documentation

## 2021-12-27 DIAGNOSIS — D500 Iron deficiency anemia secondary to blood loss (chronic): Secondary | ICD-10-CM | POA: Insufficient documentation

## 2021-12-27 DIAGNOSIS — D573 Sickle-cell trait: Secondary | ICD-10-CM | POA: Insufficient documentation

## 2021-12-27 DIAGNOSIS — Z79899 Other long term (current) drug therapy: Secondary | ICD-10-CM | POA: Insufficient documentation

## 2021-12-27 DIAGNOSIS — Z801 Family history of malignant neoplasm of trachea, bronchus and lung: Secondary | ICD-10-CM | POA: Insufficient documentation

## 2021-12-27 DIAGNOSIS — N172 Acute kidney failure with medullary necrosis: Secondary | ICD-10-CM | POA: Insufficient documentation

## 2021-12-27 DIAGNOSIS — K922 Gastrointestinal hemorrhage, unspecified: Secondary | ICD-10-CM | POA: Insufficient documentation

## 2021-12-27 DIAGNOSIS — Z833 Family history of diabetes mellitus: Secondary | ICD-10-CM | POA: Insufficient documentation

## 2021-12-27 DIAGNOSIS — F17290 Nicotine dependence, other tobacco product, uncomplicated: Secondary | ICD-10-CM | POA: Insufficient documentation

## 2021-12-27 NOTE — Telephone Encounter (Signed)
Please r/s patient for next week per Dr. Cathie Hoops. Day 1 Lab/MD Day 2 +/- PRBC.  Please notify patient of appt. Thanks

## 2021-12-28 ENCOUNTER — Inpatient Hospital Stay: Payer: Medicaid Other

## 2022-01-04 ENCOUNTER — Encounter: Payer: Self-pay | Admitting: Oncology

## 2022-01-04 ENCOUNTER — Inpatient Hospital Stay: Payer: Medicaid Other

## 2022-01-04 ENCOUNTER — Inpatient Hospital Stay (HOSPITAL_BASED_OUTPATIENT_CLINIC_OR_DEPARTMENT_OTHER): Payer: Medicaid Other | Admitting: Oncology

## 2022-01-04 VITALS — BP 119/87 | HR 69 | Temp 97.0°F | Resp 18

## 2022-01-04 DIAGNOSIS — Z801 Family history of malignant neoplasm of trachea, bronchus and lung: Secondary | ICD-10-CM | POA: Diagnosis not present

## 2022-01-04 DIAGNOSIS — N172 Acute kidney failure with medullary necrosis: Secondary | ICD-10-CM

## 2022-01-04 DIAGNOSIS — D649 Anemia, unspecified: Secondary | ICD-10-CM

## 2022-01-04 DIAGNOSIS — Z836 Family history of other diseases of the respiratory system: Secondary | ICD-10-CM | POA: Diagnosis not present

## 2022-01-04 DIAGNOSIS — Z833 Family history of diabetes mellitus: Secondary | ICD-10-CM | POA: Diagnosis not present

## 2022-01-04 DIAGNOSIS — D573 Sickle-cell trait: Secondary | ICD-10-CM

## 2022-01-04 DIAGNOSIS — Z832 Family history of diseases of the blood and blood-forming organs and certain disorders involving the immune mechanism: Secondary | ICD-10-CM | POA: Diagnosis not present

## 2022-01-04 DIAGNOSIS — D500 Iron deficiency anemia secondary to blood loss (chronic): Secondary | ICD-10-CM

## 2022-01-04 DIAGNOSIS — Z79899 Other long term (current) drug therapy: Secondary | ICD-10-CM | POA: Diagnosis not present

## 2022-01-04 DIAGNOSIS — K922 Gastrointestinal hemorrhage, unspecified: Secondary | ICD-10-CM | POA: Diagnosis not present

## 2022-01-04 DIAGNOSIS — Z8249 Family history of ischemic heart disease and other diseases of the circulatory system: Secondary | ICD-10-CM | POA: Diagnosis not present

## 2022-01-04 DIAGNOSIS — F17290 Nicotine dependence, other tobacco product, uncomplicated: Secondary | ICD-10-CM | POA: Diagnosis not present

## 2022-01-04 LAB — IRON AND TIBC
Iron: 85 ug/dL (ref 28–170)
Saturation Ratios: 28 % (ref 10.4–31.8)
TIBC: 301 ug/dL (ref 250–450)
UIBC: 216 ug/dL

## 2022-01-04 LAB — CBC WITH DIFFERENTIAL/PLATELET
Abs Immature Granulocytes: 0.01 10*3/uL (ref 0.00–0.07)
Basophils Absolute: 0.1 10*3/uL (ref 0.0–0.1)
Basophils Relative: 1 %
Eosinophils Absolute: 0.2 10*3/uL (ref 0.0–0.5)
Eosinophils Relative: 4 %
HCT: 38.1 % (ref 36.0–46.0)
Hemoglobin: 12.5 g/dL (ref 12.0–15.0)
Immature Granulocytes: 0 %
Lymphocytes Relative: 33 %
Lymphs Abs: 2 10*3/uL (ref 0.7–4.0)
MCH: 27.7 pg (ref 26.0–34.0)
MCHC: 32.8 g/dL (ref 30.0–36.0)
MCV: 84.3 fL (ref 80.0–100.0)
Monocytes Absolute: 0.4 10*3/uL (ref 0.1–1.0)
Monocytes Relative: 6 %
Neutro Abs: 3.4 10*3/uL (ref 1.7–7.7)
Neutrophils Relative %: 56 %
Platelets: 451 10*3/uL — ABNORMAL HIGH (ref 150–400)
RBC: 4.52 MIL/uL (ref 3.87–5.11)
RDW: 17.2 % — ABNORMAL HIGH (ref 11.5–15.5)
WBC: 6.1 10*3/uL (ref 4.0–10.5)
nRBC: 0 % (ref 0.0–0.2)

## 2022-01-04 LAB — FERRITIN: Ferritin: 66 ng/mL (ref 11–307)

## 2022-01-04 NOTE — Assessment & Plan Note (Signed)
History of papillary necrosis. Clinically she is doing very well. Asymptomatic. Recommend patient to avoid intensive exercise/physical training.  Avoid hyperthermia and dehydration Avoid NSAIDs.  Recommend annual urinalysis and spot urine albumin to creatinine ratio. Recommend patient to establish care with [facility] sickle cell disease program.

## 2022-01-04 NOTE — Assessment & Plan Note (Signed)
Hemoglobin has normalized.  Iron panel has also normalized.  No need for additional blood transfusion or IV iron.

## 2022-01-04 NOTE — Assessment & Plan Note (Signed)
Symptom has improved. No hematuria.

## 2022-01-04 NOTE — Progress Notes (Signed)
Hematology/Oncology Progress note [phone] [phone]            Patient Care Team: Patient, No Pcp Per as PCP - General (General Practice) Earlie Server, MD as Consulting Physician (Oncology)   CHIEF COMPLAINTS/REASON FOR VISIT:  Posthospitalization follow-up.  HISTORY OF PRESENTING ILLNESS:   Crystal Short is a  31 y.o.  female with history of sickle cell trait presents for papillary necrosis Patient was recently admitted due to iron deficiency anemia, persistent hematuria secondary to papillary necrosis.--s/p  cystoscopy with bilateral retrograde bilateral pyelogram  Patient was given supportive care, antibiotics, IV iron treatments, Amicar. Today patient denies any additional hematuria episodes.  No flank pain, fever or chills.  No dysuria symptoms. She is single, she has 1 daughter.  Per patient, daughters father was checked and he does not have sickle cell disease/trait  MEDICAL HISTORY:  Past Medical History:  Diagnosis Date   Syphilis 09/15/2016   reactive RPR 1:8   Trichomonas infection     SURGICAL HISTORY: Past Surgical History:  Procedure Laterality Date   CYSTOSCOPY W/ RETROGRADES Bilateral 12/09/2021   Procedure: CYSTOSCOPY WITH RETROGRADE PYELOGRAM;  Surgeon: Billey Co, MD;  Location: [facility];  Service: Urology;  Laterality: Bilateral;   CYSTOSCOPY WITH BIOPSY N/A 12/09/2021   Procedure: CYSTOSCOPY WITH BLADDER OR URETERAL BIOPSY;  Surgeon: Billey Co, MD;  Location: [facility];  Service: Urology;  Laterality: N/A;   CYSTOSCOPY WITH FULGERATION N/A 12/09/2021   Procedure: Rafael Hernandez AND CLOT EVACUATION;  Surgeon: Billey Co, MD;  Location: [facility];  Service: Urology;  Laterality: N/A;   NO PAST SURGERIES      SOCIAL HISTORY: Social History   Socioeconomic History   Marital status: Single    Spouse name: Not on file   Number of children: Not on file   Years of education: Not on file   Highest  education level: Not on file  Occupational History   Not on file  Tobacco Use   Smoking status: Every Day    Types: E-cigarettes, Cigars   Smokeless tobacco: Never  Vaping Use   Vaping Use: Every day   Start date: 12/02/2019   Substances: Nicotine, Flavoring  Substance and Sexual Activity   Alcohol use: Not Currently    Alcohol/week: 8.0 standard drinks of alcohol    Types: 5 Cans of beer, 3 Shots of liquor per week   Drug use: Not Currently    Types: Marijuana    Comment: daily   Sexual activity: Yes    Partners: Male    Birth control/protection: Condom  Other Topics Concern   Not on file  Social History Narrative   Not on file   Social Determinants of Health   Financial Resource Strain: Not on file  Food Insecurity: Not on file  Transportation Needs: Not on file  Physical Activity: Not on file  Stress: Not on file  Social Connections: Not on file  Intimate Partner Violence: Not At Risk (08/10/2021)   Humiliation, Afraid, Rape, and Kick questionnaire    Fear of Current or Ex-Partner: No    Emotionally Abused: No    Physically Abused: No    Sexually Abused: No    FAMILY HISTORY: Family History  Problem Relation Age of Onset   Diabetes Mother    Lung disease Mother    Lung cancer Mother    Sickle cell trait Father    Diabetes Brother    Diabetes Maternal Grandmother    Sickle  cell trait Paternal Grandmother    Congestive Heart Failure Paternal Grandfather    Sickle cell anemia Half-Sister     ALLERGIES:  is allergic to orange fruit [citrus].  MEDICATIONS:  Current Outpatient Medications  Medication Sig Dispense Refill   ferrous sulfate 325 (65 FE) MG tablet Take 1 tablet (325 mg total) by mouth 2 (two) times daily with a meal. 60 tablet 3   HYDROcodone-acetaminophen (NORCO) 7.5-325 MG tablet Take 1 tablet by mouth every 8 (eight) hours as needed for moderate pain or severe pain. 15 tablet 0   tamsulosin (FLOMAX) 0.4 MG CAPS capsule Take 1 capsule (0.4 mg  total) by mouth daily. 30 capsule 0   No current facility-administered medications for this visit.    Review of Systems  Constitutional:  Negative for appetite change, chills, fatigue and fever.  HENT:   Negative for hearing loss and voice change.   Eyes:  Negative for eye problems.  Respiratory:  Negative for chest tightness and cough.   Cardiovascular:  Negative for chest pain.  Gastrointestinal:  Negative for abdominal distention, abdominal pain and blood in stool.  Endocrine: Negative for hot flashes.  Genitourinary:  Negative for difficulty urinating and frequency.   Musculoskeletal:  Negative for arthralgias.  Skin:  Negative for itching and rash.  Neurological:  Negative for extremity weakness.  Hematological:  Negative for adenopathy.  Psychiatric/Behavioral:  Negative for confusion.    PHYSICAL EXAMINATION: ECOG PERFORMANCE STATUS: 0 - Asymptomatic Vitals:   01/04/22 1034  BP: 119/87  Pulse: 69  Resp: 18  Temp: (!) 97 F (36.1 C)   There were no vitals filed for this visit.  Physical Exam Constitutional:      General: She is not in acute distress. HENT:     Head: Normocephalic and atraumatic.  Eyes:     General: No scleral icterus. Cardiovascular:     Rate and Rhythm: Normal rate.  Pulmonary:     Effort: Pulmonary effort is normal.  Abdominal:     General: There is no distension.  Musculoskeletal:        General: No deformity. Normal range of motion.     Cervical back: Normal range of motion and neck supple.  Skin:    General: Skin is warm and dry.  Neurological:     Mental Status: She is alert and oriented to person, place, and time. Mental status is at baseline.     Cranial Nerves: No cranial nerve deficit.     Coordination: Coordination normal.  Psychiatric:        Mood and Affect: Mood normal.     LABORATORY DATA:  I have reviewed the data as listed    Latest Ref Rng & Units 01/04/2022   10:22 AM 12/11/2021   11:27 AM 12/08/2021    7:50 PM   CBC  WBC 4.0 - 10.5 K/uL 6.1  12.7    Hemoglobin 12.0 - 15.0 g/dL 12.5  9.2  9.6   Hematocrit 36.0 - 46.0 % 38.1  28.5    Platelets 150 - 400 K/uL 451  444        Latest Ref Rng & Units 12/12/2021    6:41 AM 12/11/2021   11:27 AM 12/05/2021    6:34 AM  CMP  Glucose 70 - 99 mg/dL 103  92  86   BUN 6 - 20 mg/dL 8  11  18    Creatinine 0.44 - 1.00 mg/dL 1.01  1.42  0.80   Sodium 135 - 145  mmol/L 138  138  141   Potassium 3.5 - 5.1 mmol/L 3.8  4.2  3.8   Chloride 98 - 111 mmol/L 109  111  111   CO2 22 - 32 mmol/L 23  24  24    Calcium 8.9 - 10.3 mg/dL 9.2  8.8  9.3   Total Protein 6.5 - 8.1 g/dL   6.7   Total Bilirubin 0.3 - 1.2 mg/dL   1.1   Alkaline Phos 38 - 126 U/L   39   AST 15 - 41 U/L   13   ALT 0 - 44 U/L   11     Lab Results  Component Value Date   IRON 85 01/04/2022   TIBC 301 01/04/2022   FERRITIN 66 01/04/2022     RADIOGRAPHIC STUDIES: I have personally reviewed the radiological images as listed and agreed with the findings in the report. US RENAL  Result Date: 12/11/2021 CLINICAL DATA:  Abdominal pain for 3 days EXAM: RENAL / URINARY TRACT ULTRASOUND COMPLETE COMPARISON:  CT scan of the abdomen and ultrasound of the kidneys December 04, 2021 FINDINGS: Right Kidney: Renal measurements: 10.4 x 5.1 x 4.1 cm = volume: 115 mL. Echogenicity within normal limits. No mass or hydronephrosis visualized. Left Kidney: Renal measurements: 11.3 x 6.3 x 5.9 cm = volume: 220 mL. Mild hydronephrosis. Probable small stone in the mid kidney. Possible small stone in the prominent proximal left ureter. There may also be debris in the adjacent ureter. Perinephric fluid is identified on the left. Bladder: The bladder wall is prominent in caliber. The bladder is otherwise normal. No left ureteral jet noted. Other: None. IMPRESSION: 1. Findings are concerning for a small stone and debris in the prominent proximal left ureter with new mild left hydronephrosis. CT imaging could better evaluate. There  also appears to be a 6 mm stone in the left kidney. The perinephric stranding/fluid on the left is likely reactive. 2. The right kidney is unremarkable. 3. The bladder wall is prominent. The bladder is otherwise normal. However, a left ureteral jet is not noted. Electronically Signed   By: Dorise Bullion III M.D.   On: 12/11/2021 13:12   DG OR UROLOGY CYSTO IMAGE ([place])  Result Date: 12/09/2021 There is no interpretation for this exam.  This order is for images obtained during a surgical procedure.  Please See "Surgeries" Tab for more information regarding the procedure.   MR BRAIN WO CONTRAST  Result Date: 12/05/2021 CLINICAL DATA:  Headache and blurry vision EXAM: MRI HEAD WITHOUT CONTRAST TECHNIQUE: Multiplanar, multiecho pulse sequences of the brain and surrounding structures were obtained without intravenous contrast. COMPARISON:  None Available. FINDINGS: Brain: No acute infarct, mass effect or extra-axial collection. No acute or chronic hemorrhage. Normal white matter signal, parenchymal volume and CSF spaces. The midline structures are normal. Vascular: Major flow voids are preserved. Skull and upper cervical spine: Normal calvarium and skull base. Visualized upper cervical spine and soft tissues are normal. Sinuses/Orbits:No paranasal sinus fluid levels or advanced mucosal thickening. No mastoid or middle ear effusion. Normal orbits. IMPRESSION: Normal brain MRI. Electronically Signed   By: Ulyses Jarred M.D.   On: 12/05/2021 01:40   US Renal  Result Date: 12/04/2021 CLINICAL DATA:  Gross hematuria. EXAM: RENAL / URINARY TRACT ULTRASOUND COMPLETE COMPARISON:  None Available. FINDINGS: Right Kidney: Renal measurements: 9.8 cm x 3.2 cm x 4.2 cm = volume: 70.2 mL. Echogenicity within normal limits. No mass or hydronephrosis visualized. Left Kidney: Renal measurements:  10.0 cm x 5.6 cm x 4.3 cm = volume: 123 mL. Echogenicity within normal limits. No mass or hydronephrosis visualized. Bladder:  Poorly distended and subsequently limited in evaluation. Other: None. IMPRESSION: Limited visualization of the urinary bladder, as described above, with normal ultrasonographic appearance of kidneys. Electronically Signed   By: Aram Candela M.D.   On: 12/04/2021 23:50   CT ABDOMEN PELVIS WO CONTRAST  Result Date: 12/04/2021 CLINICAL DATA:  Pelvic pain. Negative beta HCG. Hematuria. Epigastric pain. Hematuria and hematemesis for 3 weeks. Patient has been treated with antibiotics for kidney infection for 2 weeks. Syncopal episodes for 3 weeks. EXAM: CT ABDOMEN AND PELVIS WITHOUT CONTRAST TECHNIQUE: Multidetector CT imaging of the abdomen and pelvis was performed following the standard protocol without IV contrast. RADIATION DOSE REDUCTION: This exam was performed according to the departmental dose-optimization program which includes automated exposure control, adjustment of the mA and/or kV according to patient size and/or use of iterative reconstruction technique. COMPARISON:  08/26/2016 FINDINGS: Lower chest: Lung bases are clear. Hepatobiliary: Layering density in the gallbladder consistent with small stones or milk of calcium. No gallbladder wall thickening or infiltration. No focal liver lesions. Pancreas: Unremarkable. No pancreatic ductal dilatation or surrounding inflammatory changes. Spleen: Normal in size without focal abnormality. Adrenals/Urinary Tract: Adrenal glands are unremarkable. Kidneys are normal, without renal calculi, focal lesion, or hydronephrosis. Bladder is decompressed, limiting evaluation but no focal lesions or stones are identified. Stomach/Bowel: Stomach is within normal limits. Appendix appears normal. No evidence of bowel wall thickening, distention, or inflammatory changes. Vascular/Lymphatic: No significant vascular findings are present. No enlarged abdominal or pelvic lymph nodes. Reproductive: Uterus and bilateral adnexa are unremarkable. Other: No abdominal wall hernia or  abnormality. No abdominopelvic ascites. Musculoskeletal: No acute or significant osseous findings. IMPRESSION: 1. Cholelithiasis with small stones versus milk of calcium layering in the gallbladder. No inflammatory changes appreciated. 2. No evidence of bowel obstruction or inflammation. 3. No renal or ureteral stone or obstruction. Electronically Signed   By: Burman Nieves M.D.   On: 12/04/2021 18:11       ASSESSMENT & PLAN:   Sickle-cell trait (HCC) History of papillary necrosis. Clinically she is doing very well. Asymptomatic. Recommend patient to avoid intensive exercise/physical training.  Avoid hyperthermia and dehydration Avoid NSAIDs.  Recommend annual urinalysis and spot urine albumin to creatinine ratio. Recommend patient to establish care with [facility] sickle cell disease program.  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 No hematuria.  Iron deficiency anemia due to chronic blood loss Hemoglobin has normalized.  Iron panel has also normalized.  No need for additional blood transfusion or IV iron.   Orders Placed This Encounter  Procedures   CBC with Differential    Standing Status:   Future    Standing Expiration Date:   01/04/2023   Comprehensive metabolic panel    Standing Status:   Future    Standing Expiration Date:   01/04/2023   Iron and TIBC ([facility] ONLY)    Standing Status:   Future    Standing Expiration Date:   01/05/2023   Ferritin    Standing Status:   Future    Standing Expiration Date:   01/05/2023   Urinalysis, Complete w Microscopic    Standing Status:   Future    Standing Expiration Date:   01/05/2023   AMB REFERRAL FOR SICKLE CELL CLINIC    Referral Priority:   Routine    Referral Type:   Consultation    Referral Reason:  Specialty Services Required    Number of Visits Requested:   1    All questions were answered. The patient knows to call the clinic with any problems, questions or concerns.  1 year follow-up.    Thank you for this kind referral and the opportunity to participate in the care of this patient. A copy of today's note is routed to referring provider   Rickard Patience, MD, PhD [facility] Hematology Oncology 01/04/2022

## 2022-01-05 ENCOUNTER — Encounter: Payer: Self-pay | Admitting: Urology

## 2022-01-05 ENCOUNTER — Encounter: Payer: Medicaid Other | Admitting: Urology

## 2022-01-06 ENCOUNTER — Ambulatory Visit: Payer: Medicaid Other

## 2022-02-16 ENCOUNTER — Ambulatory Visit: Payer: Medicaid Other

## 2022-04-17 ENCOUNTER — Ambulatory Visit: Payer: Medicaid Other

## 2022-04-26 ENCOUNTER — Telehealth: Payer: Self-pay

## 2022-04-26 NOTE — Telephone Encounter (Signed)
Referral to [facility] sickle cell disease program was faxed back in August. Received fax today, stating that she no showed to consult at [facility]

## 2022-09-11 ENCOUNTER — Ambulatory Visit: Payer: Medicaid Other

## 2022-10-20 ENCOUNTER — Ambulatory Visit: Payer: Medicaid Other | Admitting: Family Medicine

## 2022-10-20 DIAGNOSIS — Z113 Encounter for screening for infections with a predominantly sexual mode of transmission: Secondary | ICD-10-CM

## 2022-10-20 LAB — WET PREP FOR TRICH, YEAST, CLUE
Trichomonas Exam: NEGATIVE
Yeast Exam: NEGATIVE

## 2022-10-20 NOTE — Progress Notes (Unsigned)
Pt is here for STD screening.  She is asymptomatic. Pt declined HIV and Syphilis testing.  Wet mount results reviewed, no treatment required per SO. Condoms declined.   Pt was not seen by a Provider.  Berdie Ogren, RN

## 2022-11-01 NOTE — Addendum Note (Signed)
Addended by: Berdie Ogren on: 11/01/2022 01:33 PM   Modules accepted: Orders

## 2023-01-08 ENCOUNTER — Inpatient Hospital Stay: Payer: Medicaid Other | Admitting: Oncology

## 2023-01-08 ENCOUNTER — Inpatient Hospital Stay: Payer: Medicaid Other | Attending: Oncology

## 2023-01-26 ENCOUNTER — Emergency Department
Admission: EM | Admit: 2023-01-26 | Discharge: 2023-01-26 | Disposition: A | Discharge status: Home / Self Care | Payer: Self-pay | Attending: Emergency Medicine | Admitting: Emergency Medicine

## 2023-01-26 ENCOUNTER — Emergency Department: Payer: Self-pay

## 2023-01-26 ENCOUNTER — Other Ambulatory Visit: Payer: Self-pay

## 2023-01-26 ENCOUNTER — Telehealth: Payer: Self-pay

## 2023-01-26 DIAGNOSIS — O26851 Spotting complicating pregnancy, first trimester: Secondary | ICD-10-CM | POA: Insufficient documentation

## 2023-01-26 DIAGNOSIS — Z3A01 Less than 8 weeks gestation of pregnancy: Secondary | ICD-10-CM | POA: Insufficient documentation

## 2023-01-26 DIAGNOSIS — Z3491 Encounter for supervision of normal pregnancy, unspecified, first trimester: Secondary | ICD-10-CM

## 2023-01-26 LAB — PREGNANCY, URINE: Preg Test, Ur: POSITIVE — AB

## 2023-01-26 LAB — CBC WITH DIFFERENTIAL/PLATELET
Abs Immature Granulocytes: 0.02 10*3/uL (ref 0.00–0.07)
Basophils Absolute: 0.1 10*3/uL (ref 0.0–0.1)
Basophils Relative: 1 %
Eosinophils Absolute: 0.3 10*3/uL (ref 0.0–0.5)
Eosinophils Relative: 3 %
HCT: 38.7 % (ref 36.0–46.0)
Hemoglobin: 13.3 g/dL (ref 12.0–15.0)
Immature Granulocytes: 0 %
Lymphocytes Relative: 23 %
Lymphs Abs: 2.1 10*3/uL (ref 0.7–4.0)
MCH: 28.5 pg (ref 26.0–34.0)
MCHC: 34.4 g/dL (ref 30.0–36.0)
MCV: 82.9 fL (ref 80.0–100.0)
Monocytes Absolute: 0.6 10*3/uL (ref 0.1–1.0)
Monocytes Relative: 7 %
Neutro Abs: 5.9 10*3/uL (ref 1.7–7.7)
Neutrophils Relative %: 66 %
Platelets: 335 10*3/uL (ref 150–400)
RBC: 4.67 MIL/uL (ref 3.87–5.11)
RDW: 12.4 % (ref 11.5–15.5)
WBC: 8.9 10*3/uL (ref 4.0–10.5)
nRBC: 0 % (ref 0.0–0.2)

## 2023-01-26 LAB — COMPREHENSIVE METABOLIC PANEL
ALT: 10 U/L (ref 0–44)
AST: 14 U/L — ABNORMAL LOW (ref 15–41)
Albumin: 4.1 g/dL (ref 3.5–5.0)
Alkaline Phosphatase: 45 U/L (ref 38–126)
Anion gap: 6 (ref 5–15)
BUN: 12 mg/dL (ref 6–20)
CO2: 25 mmol/L (ref 22–32)
Calcium: 9.1 mg/dL (ref 8.9–10.3)
Chloride: 102 mmol/L (ref 98–111)
Creatinine, Ser: 0.66 mg/dL (ref 0.44–1.00)
GFR, Estimated: >60 mL/min (ref >60)
Glucose, Bld: 93 mg/dL (ref 70–99)
Potassium: 3.9 mmol/L (ref 3.5–5.1)
Sodium: 133 mmol/L — ABNORMAL LOW (ref 135–145)
Total Bilirubin: 0.6 mg/dL (ref 0.3–1.2)
Total Protein: 6.8 g/dL (ref 6.5–8.1)

## 2023-01-26 LAB — URINALYSIS, ROUTINE W REFLEX MICROSCOPIC
Bilirubin Urine: NEGATIVE
Glucose, UA: NEGATIVE mg/dL
Hgb urine dipstick: NEGATIVE
Ketones, ur: NEGATIVE mg/dL
Nitrite: NEGATIVE
Protein, ur: NEGATIVE mg/dL
Specific Gravity, Urine: 1.015 (ref 1.005–1.030)
pH: 7 (ref 5.0–8.0)

## 2023-01-26 LAB — HCG, QUANTITATIVE, PREGNANCY: hCG, Beta Chain, Quant, S: 46698 m[IU]/mL — ABNORMAL HIGH (ref <5)

## 2023-01-26 LAB — LIPASE, BLOOD: Lipase: 33 U/L (ref 11–51)

## 2023-01-26 NOTE — Discharge Instructions (Addendum)
Please follow-up with OB/GYN for ongoing care.  Return to the emergency department for any significant abdominal pain significant bleeding or any other symptom concerning to yourself.

## 2023-01-26 NOTE — ED Provider Notes (Signed)
[facility] Provider Note    Event Date/Time   First MD Initiated Contact with Patient 01/26/23 1122     (approximate)  History   Chief Complaint: Weakness and Epistaxis  HPI  Crystal Short is a 32 y.o. female who presents to the emergency department for vaginal spotting.  According to the patient for the last 2 days she has had intermittent spotting and a nosebleed last night.  Patient states she had a normal menstrual cycle last month but was slightly late this month.  States her period should have started over the weekend Monday or Tuesday.  States that has been spotting but has not really started.  States she took a pregnancy test that was positive however states she took another 1 and thought it was negative.  Patient also states she has been feeling short of breath however her mother states she believes this is likely due to the patient's anxiety.  Patient denies any chest pain.  No pleuritic pain.  No abdominal pain.  Physical Exam   Triage Vital Signs: ED Triage Vitals  Encounter Vitals Group     BP 01/26/23 1045 123/83     Systolic BP Percentile --      Diastolic BP Percentile --      Pulse Rate 01/26/23 1045 69     Resp 01/26/23 1045 16     Temp 01/26/23 1045 98.2 F (36.8 C)     Temp Source 01/26/23 1045 Oral     SpO2 01/26/23 1045 100 %     Weight 01/26/23 1048 145 lb (65.8 kg)     Height 01/26/23 1048 5\' 5"  (1.651 m)     Head Circumference --      Peak Flow --      Pain Score 01/26/23 1047 9     Pain Loc --      Pain Education --      Exclude from Growth Chart --     Most recent vital signs: Vitals:   01/26/23 1045  BP: 123/83  Pulse: 69  Resp: 16  Temp: 98.2 F (36.8 C)  SpO2: 100%    General: Awake, no distress.  CV:  Good peripheral perfusion.  Regular rate and rhythm  Resp:  Normal effort.  Equal breath sounds bilaterally.  Abd:  No distention.  Soft, nontender.  No rebound or guarding.  ED Results / Procedures /  Treatments   RADIOLOGY  I have reviewed and interpreted x-ray images.  No finding of my evaluation.  Chest x-ray read as negative by radiology. Radiology has read a single live intrauterine pregnancy estimated be 6 weeks and 1 day with a fetal heart rate of 108 bpm.   MEDICATIONS ORDERED IN ED: Medications - No data to display   IMPRESSION / MDM / ASSESSMENT AND PLAN / ED COURSE  I reviewed the triage vital signs and the nursing notes.  Patient's presentation is most consistent with acute presentation with potential threat to life or bodily function.  Patient presents the emergency department for vaginal spotting.  Patient's labs have resulted showing a reassuring CBC with normal blood counts normal white blood cell count, reassuring chemistry normal LFTs and lipase, urine pregnancy test is positive and urinalysis shows no concerning findings.  Given the positive urine pregnancy test we have added on the beta-hCG which has resulted at 46,000.  We will obtain an ultrasound to further evaluate.  Patient agreeable to plan of care.  Given the patient's shortness of breath  we will obtain a chest x-ray with abdominal shielding.  B+ blood type does not require RhoGAM.  Patient's ultrasound shows intrauterine pregnancy at 6 weeks and 1 day with a fetal heart rate of 108 bpm.  Remainder of the workup is nonrevealing.  Patient appears well.  She states she is ready to go home.  Will discharge with routine OB follow-up.  FINAL CLINICAL IMPRESSION(S) / ED DIAGNOSES   First trimester pregnancy   Note:  This document was prepared using Dragon voice recognition software and may include unintentional dictation errors.   Minna Antis, MD 01/26/23 1535

## 2023-01-26 NOTE — ED Notes (Signed)
Patient taken to ultrasound.

## 2023-01-26 NOTE — ED Notes (Signed)
Security stated pt walked outside. Pt looked for and no where outside.

## 2023-01-26 NOTE — Telephone Encounter (Signed)
Call received from Royden Purl stating client is here and reports pregnant with bleeding. Per client, she is an [facility] client. Chart reviewed and client has not been seen in [facility] with current pregnancy. Referred to ED for evaluation of bleeding. Jossie Ng, RN

## 2023-01-26 NOTE — ED Notes (Signed)
No nosebleed noted at this time. Patient c/o pain from epigastric to mid-abdomen. No nausea at this time.

## 2023-01-26 NOTE — ED Triage Notes (Signed)
Pt states that she found out Tuesday that she had a pos pregnancy, states for 2 days she has been spotting, not this am, states that when she pushes to urinated she cramps, states that she feels weak, had a nosebleed last pm and reports just not feeling right

## 2023-01-27 LAB — SAMPLE TO BLOOD BANK

## 2023-10-09 ENCOUNTER — Ambulatory Visit

## 2024-03-04 ENCOUNTER — Ambulatory Visit

## 2024-03-04 DIAGNOSIS — Z113 Encounter for screening for infections with a predominantly sexual mode of transmission: Secondary | ICD-10-CM

## 2024-03-04 DIAGNOSIS — B3731 Acute candidiasis of vulva and vagina: Secondary | ICD-10-CM

## 2024-03-04 LAB — WET PREP FOR TRICH, YEAST, CLUE
Clue Cell Exam: NEGATIVE
Trichomonas Exam: NEGATIVE

## 2024-03-04 MED ORDER — CLOTRIMAZOLE 1 % VA CREA: 1.0000 | TOPICAL_CREAM | Freq: Every day | VAGINAL | Status: AC

## 2024-03-04 NOTE — Addendum Note (Signed)
 Addended by: ROSABEL DAMIEN BRAVO on: 03/04/2024 02:14 PM   Modules accepted: Orders

## 2024-03-04 NOTE — Progress Notes (Signed)
 Pt here for STI screening.  Wet mount results reviewed with patient.  Results positive for moderate yeast.  Clotrimazole  1% vaginal cream dispensed to patient.  Counseled re medication, side effects, plan of care and when to contact clinic with questions or concerns.  Verbalizes Margherita Pry, RN

## 2024-03-04 NOTE — Progress Notes (Signed)
 [facility] STI clinic [address], [address] [place] [address] Main phone: [phone]  STI screening visit  Subjective:  Crystal Short is a 33 y.o. female being seen today for an STI screening visit. The patient reports they do not have symptoms.  Patient reports that they do not desire a pregnancy in the next year. Patient is currently using no method - no contraceptive precautions to prevent pregnancy. They reported they are interested in discussing contraception - she wants to make an appt soon for contraception.  Patient's last menstrual period was 02/15/2024 (approximate).  Patient has the following medical conditions:  Patient Active Problem List   Diagnosis Date Noted   Iron  deficiency anemia due to chronic blood loss 01/04/2022   Kidney papillary necrosis    Pyelonephritis    Acute blood loss anemia    Symptomatic anemia 12/05/2021   Persistent headaches 12/05/2021   Gross hematuria 12/05/2021   Dizziness 12/05/2021   GIB (gastrointestinal bleeding) 12/05/2021   Hematemesis 12/05/2021   Syphilis 02/11/2021   Marijuana use 11/22/2020   Vapes nicotine containing substance 03/12/2020   Sickle-cell trait 09/21/2015   Chief Complaint  Patient presents with   SEXUALLY TRANSMITTED DISEASE   HPI Patient reports desire for STI testing. No symptoms. Recently left a relationship and wants screening.  See flowsheet for further details and programmatic requirements Hyperlink available at the top of the signed note in blue.  Flow sheet content below:  Pregnancy Intention Screening Does the patient want to become pregnant in the next year?: No Does the patient's partner want to become pregnant in the next year?: No Would the patient like to discuss contraceptive options today?: No Reason For STD Screen STD Screening: Is asymptomatic Have you ever had an STD?: Yes History of Antibiotic use in the past 2 weeks?: No STD  Symptoms Denies all: Yes Risk Factors for Hep B Household, sexual, or needle sharing contact of a person infected with Hep B: No Sexual contact with a person who uses drugs not as prescribed?: No Currently or Ever used drugs not as prescribed: No HIV Positive: No PRep Patient: No Men who have sex with men: No Have Hepatitis C: No History of Incarceration: No History of Homeslessness?: No Anal sex following anal drug use?: No Risk Factors for Hep C Currently using drugs not as prescribed: No Sexual partner(s) currently using drugs as not prescribed: No History of drug use: No HIV Positive: No People with a history of incarceration: No People born between the years of 75 and 65: No Abuse History Has patient ever been abused physically?: No Has patient ever been abused sexually?: No Does patient feel they have a problem with Anxiety?: No Does patient feel they have a problem with Depression?: No Counseling Patient counseled to use condoms with all sex: Condoms declined RTC in 2-3 weeks for test results: Yes Clinic will call if test results abnormal before test result appt.: Yes Test results given to patient Patient counseled to use condoms with all sex: Condoms declined   Screening for MPX risk:  Unexplained rash?  No   MSM?  No   Multiple or anonymous sex partners?  No   Any close or sexual contact with a person  diagnosed with MPX?  No   Any outside the [place]  where MPX is endemic?  No   High clinical suspicion for MPX?    -Unlikely to be chickenpox    -Lymphadenopathy    -Rash that presents  in same phase of       evolution on any given body part  No   Screenings: Last HIV test per patient/review of record was  Lab Results  Component Value Date   HMHIVSCREEN Negative - Validated 08/10/2021    Lab Results  Component Value Date   HIV Non Reactive 12/05/2021     Last HEPC test per patient/review of record was No results found for: HMHEPCSCREEN No components found  for: HEPC   Last HEPB test per patient/review of record was No components found for: HMHEPBSCREEN   Patient reports last pap was:   No results found for: SPECADGYN Result Date Procedure Results Follow-ups  05/20/2018 HM PAP SMEAR HM Pap smear: Negative    Immunization history:  Immunization History  Administered Date(s) Administered   Hepatitis B 02/06/93, 11/22/1992, 05/02/1993   MMR 04/05/1994, 01/02/1997   Meningococcal Conjugate 05/05/2008   Tdap 05/05/2008, 09/09/2019   Varicella 01/02/1997    The following portions of the patient's history were reviewed and updated as appropriate: allergies, current medications, past medical history, past social history, past surgical history and problem list.  Objective:  There were no vitals filed for this visit.  Physical Exam Vitals and nursing note reviewed.  Constitutional:      Appearance: Normal appearance.  HENT:     Head: Normocephalic.     Mouth/Throat:     Mouth: Mucous membranes are moist.  Cardiovascular:     Rate and Rhythm: Normal rate.  Pulmonary:     Effort: Pulmonary effort is normal.  Abdominal:     Palpations: Abdomen is soft.  Genitourinary:    Comments: Declined genital exam- no symptoms, self swabbed Musculoskeletal:        General: Normal range of motion.  Lymphadenopathy:     Head:     Right side of head: No submandibular, preauricular or posterior auricular adenopathy.     Left side of head: No submandibular, preauricular or posterior auricular adenopathy.     Cervical: No cervical adenopathy.     Upper Body:     Right upper body: No supraclavicular or axillary adenopathy.     Left upper body: No supraclavicular or axillary adenopathy.  Skin:    General: Skin is warm and dry.  Neurological:     Mental Status: She is alert and oriented to person, place, and time.  Psychiatric:        Mood and Affect: Mood normal.    Assessment and Plan:  Crystal Short is a 33 y.o. female  presenting to the [facility] Department for STI screening  1. Screening for venereal disease (Primary)  - Chlamydia/Gonorrhea [facility] - WET PREP FOR TRICH, YEAST, CLUE - HIV [facility] - Syphilis Serology, [facility]   Patient accepted the following screenings: vaginal CT/GC swab, vaginal wet prep, HIV, and RPR Patient meets criteria for HepB screening? No. Ordered? no Patient meets criteria for HepC screening? No. Ordered? no  Treat wet prep per standing order Discussed time line for [facility] results and that patient will be called with positive results and encouraged patient to call if she had not heard in 2 weeks.  Counseled to return or seek care for continued or worsening symptoms Recommended repeat testing in 3 months with positive results. Recommended condom use with all sex for STI prevention.   Return for STI testing as needed.  No future appointments.  Damien FORBES Satchel, NP

## 2024-03-28 ENCOUNTER — Other Ambulatory Visit: Payer: Self-pay

## 2024-03-28 ENCOUNTER — Emergency Department
Admission: EM | Admit: 2024-03-28 | Discharge: 2024-03-28 | Disposition: A | Discharge status: Home / Self Care | Payer: MEDICAID | Attending: Emergency Medicine | Admitting: Emergency Medicine

## 2024-03-28 DIAGNOSIS — T7840XA Allergy, unspecified, initial encounter: Secondary | ICD-10-CM | POA: Insufficient documentation

## 2024-03-28 DIAGNOSIS — T7819XA Other adverse food reactions, not elsewhere classified, initial encounter: Secondary | ICD-10-CM

## 2024-03-28 DIAGNOSIS — T783XXA Angioneurotic edema, initial encounter: Secondary | ICD-10-CM | POA: Insufficient documentation

## 2024-03-28 LAB — BASIC METABOLIC PANEL WITH GFR
Anion gap: 9 (ref 5–15)
BUN: 14 mg/dL (ref 6–20)
CO2: 25 mmol/L (ref 22–32)
Calcium: 10.1 mg/dL (ref 8.9–10.3)
Chloride: 107 mmol/L (ref 98–111)
Creatinine, Ser: 0.85 mg/dL (ref 0.44–1.00)
GFR, Estimated: >60 mL/min (ref >60)
Glucose, Bld: 87 mg/dL (ref 70–99)
Potassium: 3.8 mmol/L (ref 3.5–5.1)
Sodium: 140 mmol/L (ref 135–145)

## 2024-03-28 LAB — CBC
HCT: 38.9 % (ref 36.0–46.0)
Hemoglobin: 13.4 g/dL (ref 12.0–15.0)
MCH: 28.8 pg (ref 26.0–34.0)
MCHC: 34.4 g/dL (ref 30.0–36.0)
MCV: 83.7 fL (ref 80.0–100.0)
Platelets: 349 K/uL (ref 150–400)
RBC: 4.65 MIL/uL (ref 3.87–5.11)
RDW: 12.7 % (ref 11.5–15.5)
WBC: 6 K/uL (ref 4.0–10.5)
nRBC: 0 % (ref 0.0–0.2)

## 2024-03-28 MED ORDER — PREDNISONE 10 MG (21) PO TBPK: ORAL_TABLET | ORAL | 0 refills | Status: AC

## 2024-03-28 MED ORDER — EPINEPHRINE 0.3 MG/0.3ML IJ SOAJ: 0.3000 mg | Freq: Once | INTRAMUSCULAR | 1 refills | Status: AC

## 2024-03-28 MED ORDER — EPINEPHRINE 0.3 MG/0.3ML IJ SOAJ
0.3000 mg | Freq: Once | INTRAMUSCULAR | Status: AC
  Administered 2024-03-28: 0.3 mg via INTRAMUSCULAR
  Filled 2024-03-28: qty 0.3

## 2024-03-28 MED ORDER — METHYLPREDNISOLONE SODIUM SUCC 125 MG IJ SOLR
125.0000 mg | Freq: Once | INTRAMUSCULAR | Status: AC
  Administered 2024-03-28: 125 mg via INTRAVENOUS
  Filled 2024-03-28: qty 2

## 2024-03-28 NOTE — ED Notes (Signed)
 Patient states she needs to go home. PA-C aware.

## 2024-03-28 NOTE — ED Notes (Signed)
 Family at bedside. Patient given a warm blanket.

## 2024-03-28 NOTE — Discharge Instructions (Signed)
 Your exam and labs are normal and reassuring at this time.  No signs of any progressive angioedema or anaphylaxis but you been treated with a dose of epinephrine in the ED as well as steroid.  Continue to monitor symptoms take prescription antihistamine as directed.  Use your EpiPen for any recurrence of symptoms.  Follow-up in the ED if you have to use your epinephrine pen.  Please go to the following website to schedule new (and existing) patient appointments:   [email or web address]   The following is a list of primary care offices in the area who are accepting new patients at this time.  Please reach out to one of them directly and let them know you would like to schedule an appointment to follow up on an Emergency Department visit, and/or to establish a new primary care provider (PCP).  There are likely other primary care clinics in the are who are accepting new patients, but this is an excellent place to start:  [facility] Lead physician: Dr Jon Eva [address] [place], [place] [address] [phone]  [facility] Lead Physician: Dr Dorette Loron [address], [place], [place] [address] [phone]  [facility]  Lead Physician: Dr Duwaine Louder [address] [place], [place] [address] [phone]  [facility] Lead Physician: Dr Marolyn Officer [address] [place], [place] [address] [phone]  [facility] at [facility] Lead Physician: Dr Leita Adie [address] [place], [place], [place] [address] [phone]

## 2024-03-28 NOTE — ED Notes (Signed)
 Visitor at bedside. Patient is requesting ice chips for a sore throat. Voice is louder. Patient is still able to maintain oral secretions well. PA-C aware.

## 2024-03-28 NOTE — ED Notes (Signed)
 Patient is resting in a darkened room. Voice is strong. Respirations are even and non-labored. No acute distress.

## 2024-03-28 NOTE — ED Provider Notes (Signed)
 [facility] Emergency Department Provider Note     Event Date/Time   First MD Initiated Contact with Patient 03/28/24 1247     (approximate)   History   Allergic Reaction   HPI  Crystal Short is a 33 y.o. female with a history of sickle cell trait, iron  deficiency anemia, kidney papillary necrosis, headaches, presents to the ED endorsing a possible allergic reaction.  Patient presents herself to the ED via POV from home after noting onset of what she believes might of interaction to ingested citric acid.  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  She apparently ate at a company potluck yesterday.  She experienced episode overnight, but did not not presents up to the ED until this afternoon for evaluation.  She denies any vomiting but endorses nausea.  She also endorses a weak voice, and difficulty breathing.  Patient denies any sweating, diaphoresis, bowel changes.  She denies any swelling to her lips, but feels like it hurts to stick out her tongue.  She reports having an EpiPen, but notes is out of date, has never had to self administer epi for any allergies.  She presents to the ED for evaluation of symptoms.  Physical Exam   Triage Vital Signs: ED Triage Vitals  Encounter Vitals Group     BP 03/28/24 1220 111/89     Girls Systolic BP Percentile --      Girls Diastolic BP Percentile --      Boys Systolic BP Percentile --      Boys Diastolic BP Percentile --      Pulse Rate 03/28/24 1220 (!) 108     Resp 03/28/24 1220 18     Temp 03/28/24 1220 98.3 F (36.8 C)     Temp Source 03/28/24 1220 Oral     SpO2 03/28/24 1220 100 %     Weight 03/28/24 1222 145 lb (65.8 kg)     Height 03/28/24 1222 5' 5 (1.651 m)     Head Circumference --      Peak Flow --      Pain Score 03/28/24 1221 10     Pain Loc --      Pain Education --       Exclude from Growth Chart --     Most recent vital signs: Vitals:   03/28/24 1745 03/28/24 1749  BP:  114/77  Pulse: 66 78  Resp:  18  Temp:  98.6 F (37 C)  SpO2: 99% 100%    General Awake, no distress. NAD HEENT NCAT. PERRL. EOMI. No rhinorrhea. Mucous membranes are moist.  Uvula is midline and tonsils are flat.  No oropharyngeal lesions are appreciated.  No evidence of sublingual edema noted.  No angioedema appreciated. CV:  Good peripheral perfusion.  RRR RESP:  Normal effort.  CTA.  No wheeze, rales, or rhonchi noted. ABD:  No distention.  Soft and nontender. SKIN:  No rashes appreciated.   ED Results / Procedures / Treatments   Labs (all labs ordered are listed, but only abnormal results are displayed) Labs Reviewed  BASIC METABOLIC PANEL WITH GFR  CBC  POC URINE PREG, ED    EKG   RADIOLOGY   PROCEDURES:  Critical Care performed: No  Procedures   MEDICATIONS ORDERED IN ED: Medications  EPINEPHrine (EPI-PEN) injection 0.3 mg (0.3 mg Intramuscular Given 03/28/24 1259)  methylPREDNISolone  sodium succinate (SOLU-MEDROL) 125 mg/2 mL injection 125 mg (125 mg Intravenous Given 03/28/24 1300)     IMPRESSION / MDM / ASSESSMENT AND PLAN / ED COURSE  I reviewed the triage vital signs and the nursing notes.                              Differential diagnosis includes, but is not limited to, angioedema, anaphylaxis, contact dermatitis, asthma, bronchospasm  Patient's presentation is most consistent with acute presentation with potential threat to life or bodily function.   Patient's diagnosis is consistent with angioedema likely due to food allergy.  Patient presents to the ED on her own care, endorsing weak voice, and sensation of throat tightness and tongue swelling.  She is in no acute respiratory distress.  Labs and vital signs are reassuring for she treated with IM epinephrine and IV Solu-Medrol as well as a fluid bolus.  Patient is stable at this time  after extended observation in the ED.  She is requesting discharge home endorsing improvement of her symptoms.  Patient will be discharged home with prescriptions for prednisone and EpiPen. Patient is to follow up with her PCP as suggested as needed or otherwise directed. Patient is given ED precautions to return to the ED for any worsening or new symptoms.   FINAL CLINICAL IMPRESSION(S) / ED DIAGNOSES   Final diagnoses:  Allergic reaction to food, initial encounter  Angioedema, initial encounter     Rx / DC Orders   ED Discharge Orders          Ordered    EPINEPHrine 0.3 mg/0.3 mL IJ SOAJ injection   Once        03/28/24 1642    predniSONE (STERAPRED UNI-PAK 21 TAB) 10 MG (21) TBPK tablet        03/28/24 1747             Note:  This document was prepared using Dragon voice recognition software and may include unintentional dictation errors.    Loyd Candida LULLA Aldona, PA-C 03/28/24 1939    Jacolyn Pae, MD 03/28/24 [phone]

## 2024-03-28 NOTE — ED Notes (Signed)
Gave pt warm blanket.

## 2024-03-28 NOTE — ED Notes (Signed)
Patient was given ice chips. 

## 2024-03-28 NOTE — ED Triage Notes (Signed)
 Pt arrives via POV with c/o a possible allergic reaction with SOB. Pt states that they are having issues swallowing, throat feels tights, trouble speaking and it hurts to stick their tongue out. Pt is allergic to citrus and doesn't recall eating any but ate at a company pot luck meal yesterday and woke up with these symptoms today.

## 2024-06-17 ENCOUNTER — Ambulatory Visit

## 2024-06-26 ENCOUNTER — Ambulatory Visit
